# Patient Record
Sex: Male | Born: 1980 | Race: White | Hispanic: No | Marital: Married | Smoking: Never smoker
Health system: Southern US, Community
[De-identification: ages and names within clinical notes are randomized; demographics above are authoritative.]

## PROBLEM LIST (undated history)

## (undated) DIAGNOSIS — Z95828 Presence of other vascular implants and grafts: Secondary | ICD-10-CM

## (undated) DIAGNOSIS — B019 Varicella without complication: Secondary | ICD-10-CM

## (undated) DIAGNOSIS — I429 Cardiomyopathy, unspecified: Secondary | ICD-10-CM

## (undated) HISTORY — DX: Presence of other vascular implants and grafts: Z95.828

## (undated) HISTORY — DX: Varicella without complication: B01.9

## (undated) HISTORY — DX: Cardiomyopathy, unspecified: I42.9

## (undated) HISTORY — PX: LEG SURGERY: SHX1003

## (undated) HISTORY — PX: ANTERIOR CRUCIATE LIGAMENT REPAIR: SHX115

---
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
## 2003-06-25 HISTORY — PX: IVC FILTER INSERTION: CATH118245

## 2003-06-25 HISTORY — PX: ABDOMINAL SURGERY: SHX537

## 2003-06-25 HISTORY — PX: BACK SURGERY: SHX140

## 2003-10-09 ENCOUNTER — Inpatient Hospital Stay (HOSPITAL_COMMUNITY): Admission: AC | Admit: 2003-10-09 | Discharge: 2003-10-17 | Payer: Self-pay

## 2003-10-17 ENCOUNTER — Inpatient Hospital Stay (HOSPITAL_COMMUNITY)
Admission: RE | Admit: 2003-10-17 | Discharge: 2003-10-21 | Payer: Self-pay | Admitting: Physical Medicine & Rehabilitation

## 2003-11-15 ENCOUNTER — Encounter: Admission: RE | Admit: 2003-11-15 | Discharge: 2003-11-15 | Payer: Self-pay | Admitting: Neurosurgery

## 2003-11-21 ENCOUNTER — Observation Stay (HOSPITAL_COMMUNITY): Admission: AD | Admit: 2003-11-21 | Discharge: 2003-11-22 | Payer: Self-pay | Admitting: Orthopedic Surgery

## 2003-12-15 ENCOUNTER — Encounter: Admission: RE | Admit: 2003-12-15 | Discharge: 2003-12-15 | Payer: Self-pay | Admitting: Neurosurgery

## 2003-12-22 ENCOUNTER — Ambulatory Visit (HOSPITAL_COMMUNITY): Admission: RE | Admit: 2003-12-22 | Discharge: 2003-12-22 | Payer: Self-pay | Admitting: Diagnostic Radiology

## 2004-01-12 ENCOUNTER — Encounter: Admission: RE | Admit: 2004-01-12 | Discharge: 2004-01-12 | Payer: Self-pay | Admitting: Neurosurgery

## 2004-01-16 ENCOUNTER — Encounter
Admission: RE | Admit: 2004-01-16 | Discharge: 2004-04-15 | Payer: Self-pay | Admitting: Physical Medicine & Rehabilitation

## 2004-04-03 ENCOUNTER — Encounter: Admission: RE | Admit: 2004-04-03 | Discharge: 2004-04-03 | Payer: Self-pay | Admitting: Neurosurgery

## 2004-06-24 HISTORY — PX: REFRACTIVE SURGERY: SHX103

## 2004-10-09 ENCOUNTER — Encounter: Admission: RE | Admit: 2004-10-09 | Discharge: 2004-10-09 | Payer: Self-pay | Admitting: Neurosurgery
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
## 2004-12-08 IMAGING — CR DG LUMBAR SPINE 2-3V
3 series · 3 of 3 positions shown · non-contrast
Comparison: none

CLINICAL DATA: Status post lumbar surgery. 
 LUMBAR SPINE 2-3 VIEWS: 
 Comparison 12/15/03.  There has been a previous posterior lumbar fusion from the L2 through L5 levels.  The posterior bars and pedicle screws appear intact and in satisfactory position.  The mild compression of L3 is unchanged.  The inferior vena also remains stable in position.

[view not recorded (1 of 3)]
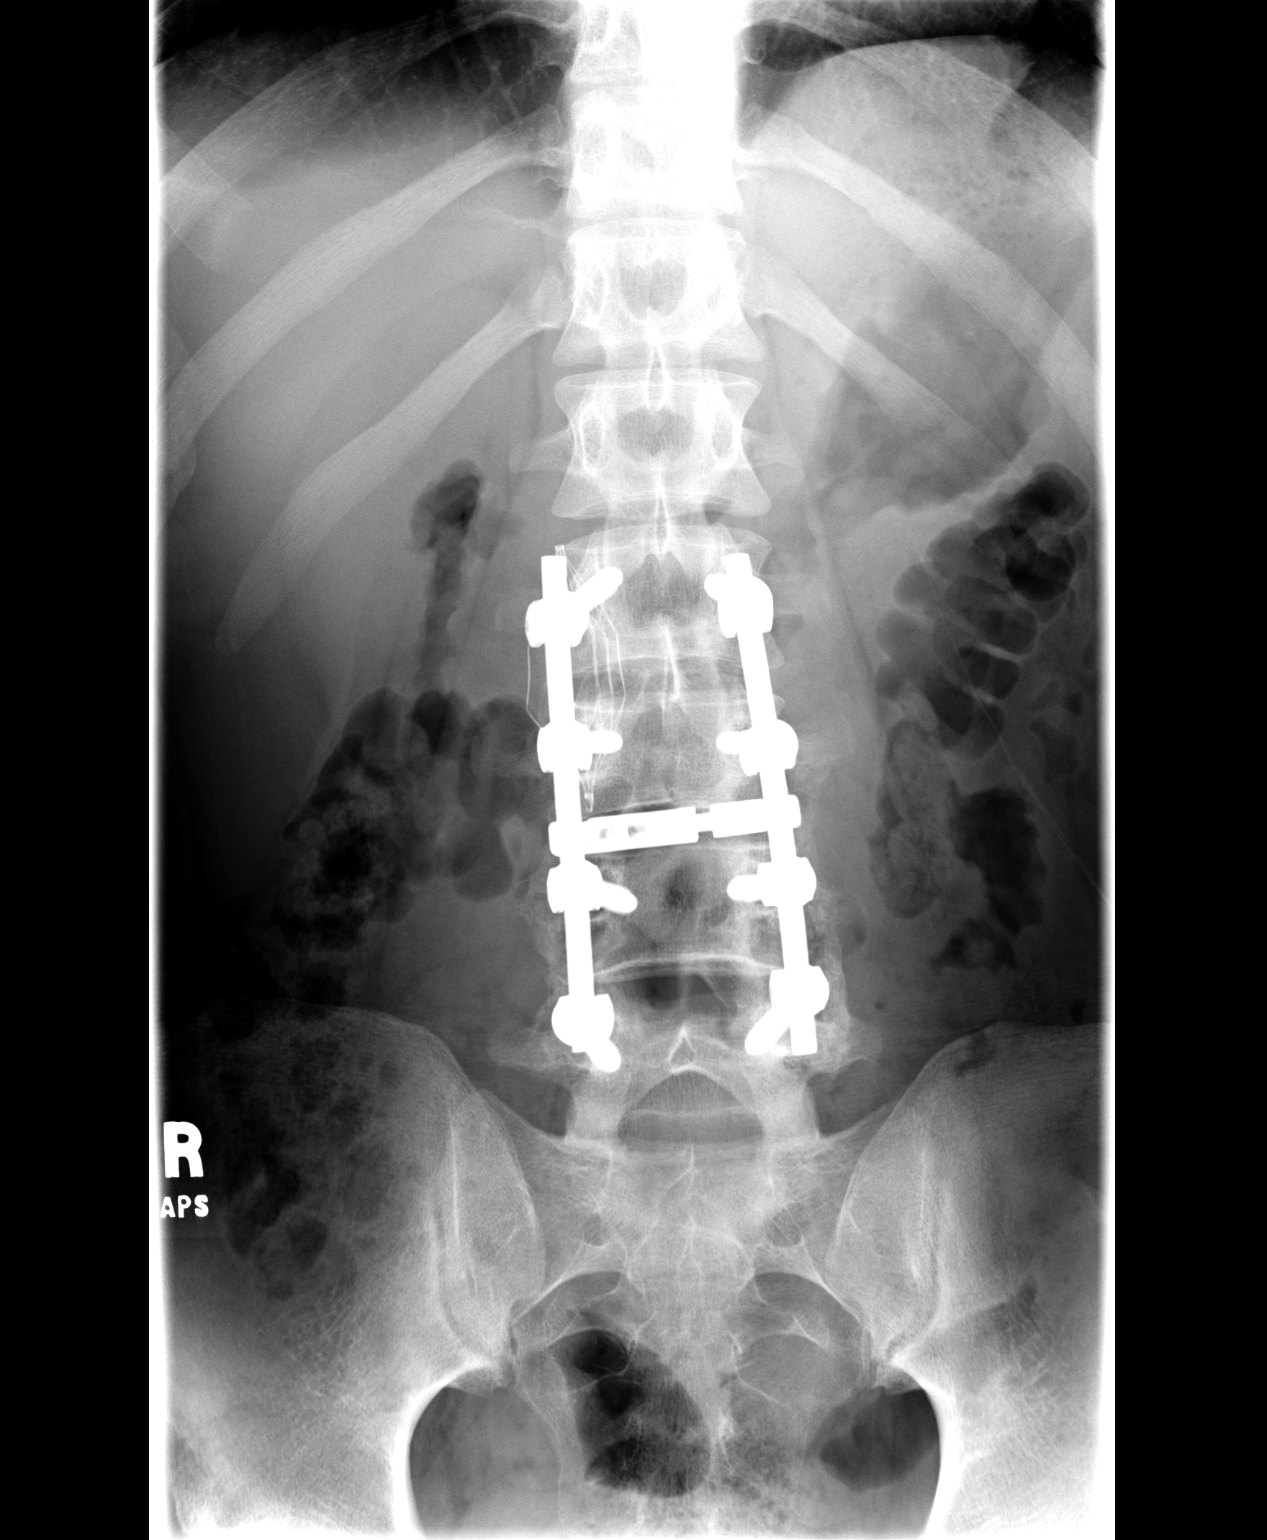

[view not recorded (2 of 3)]
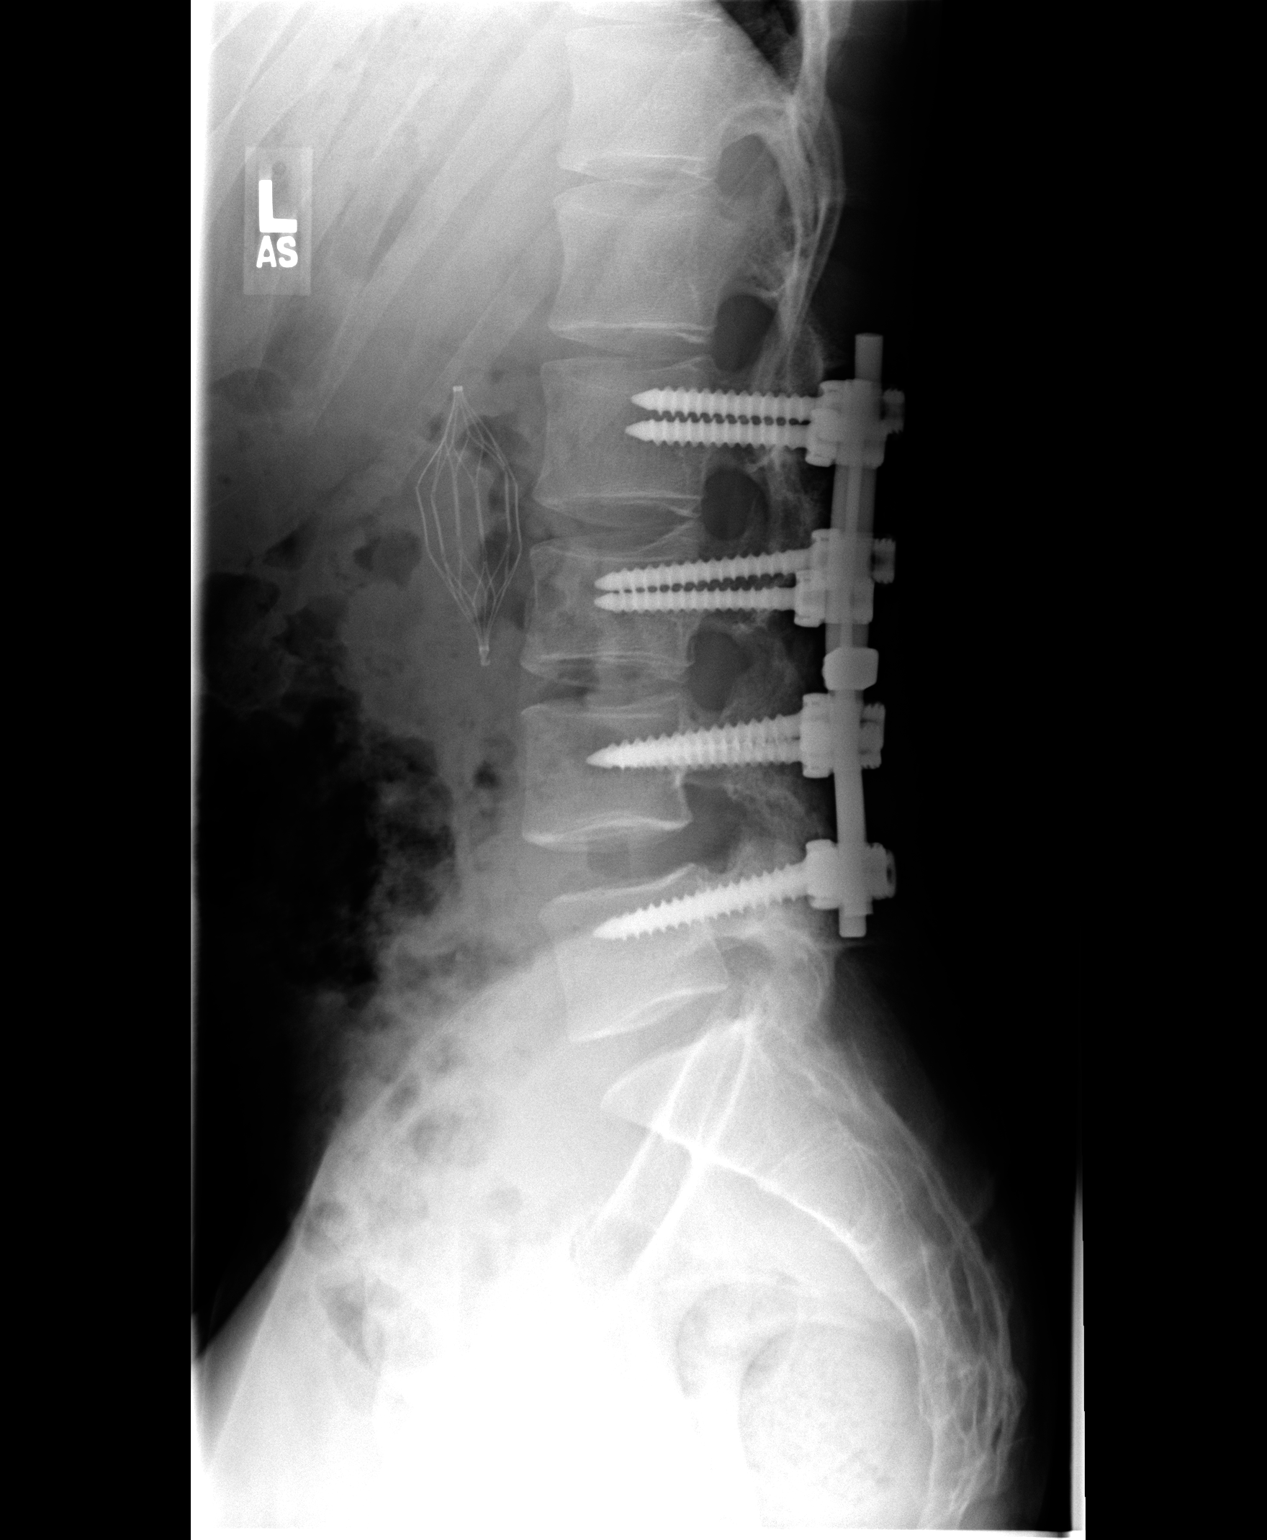

[view not recorded (3 of 3)]
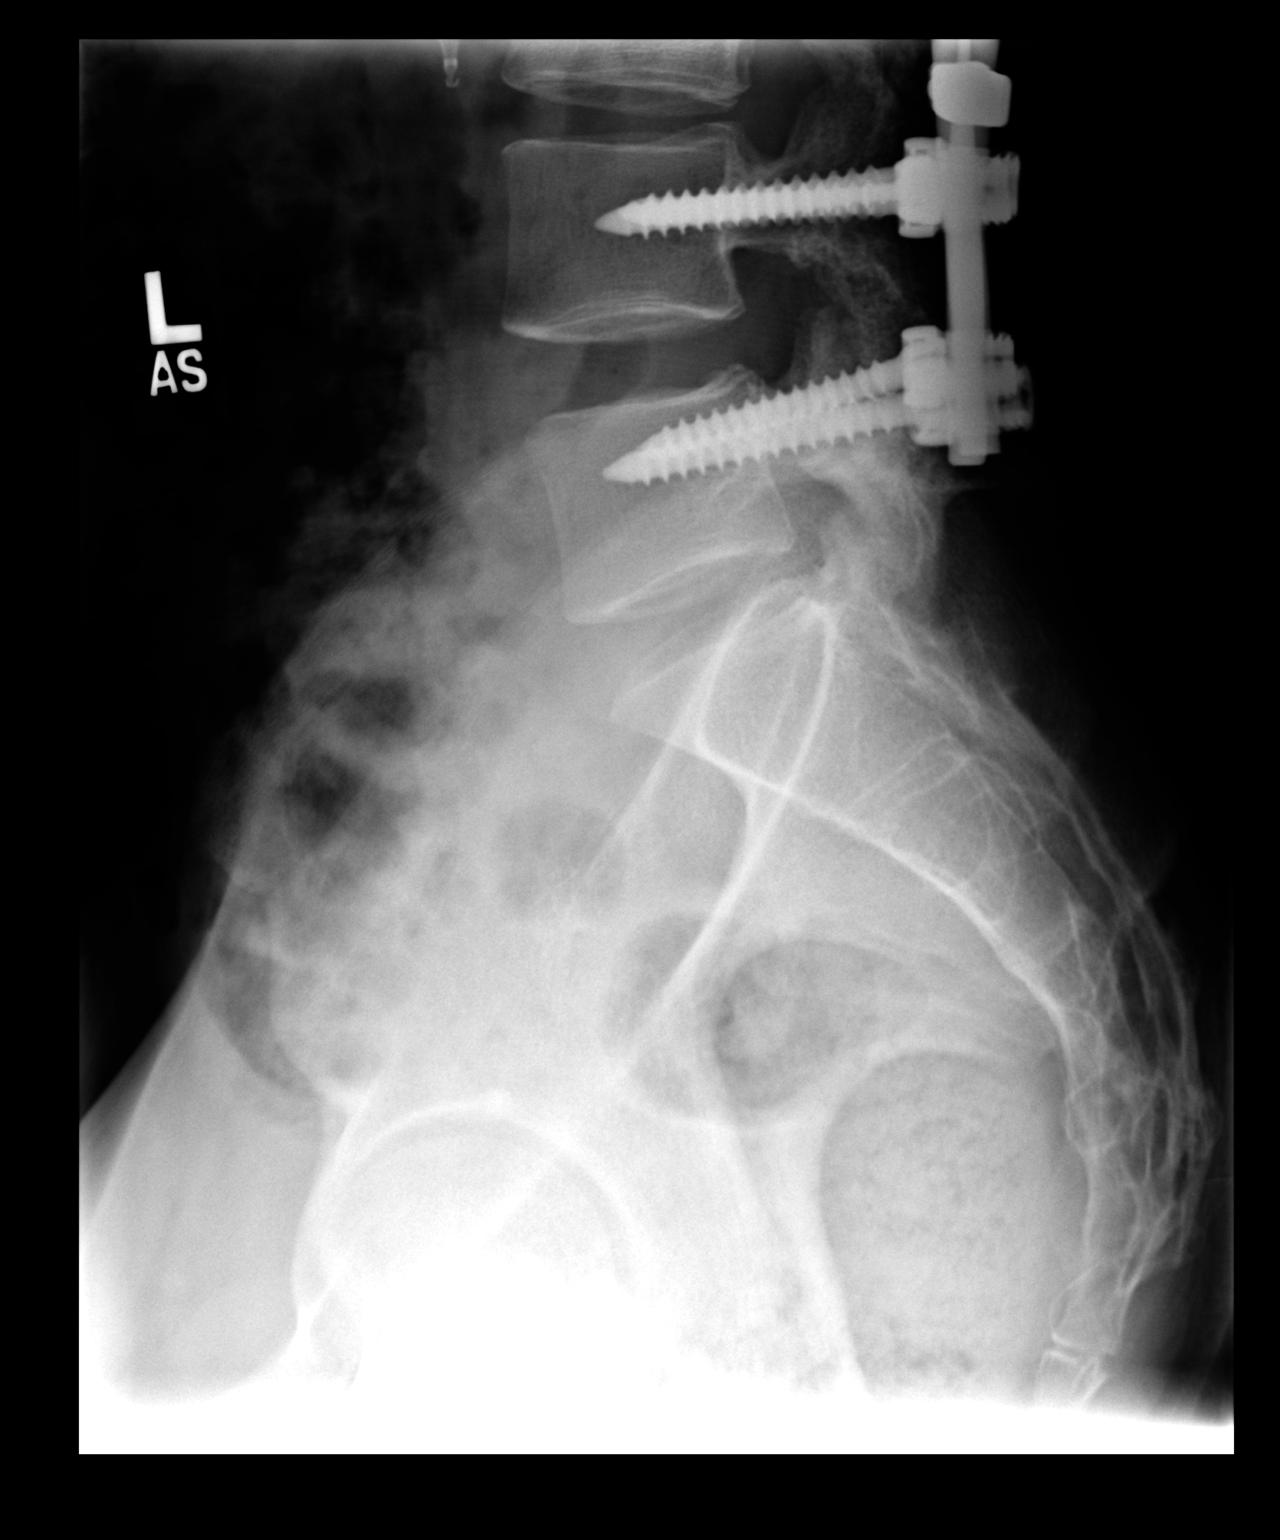

[3 of 3 positions shown; findings below may reference images not displayed]

IMPRESSION: Stable appearance of the posterior lumbar fusion L2 through L5 levels.  Stable position of inferior vena cava.

## 2007-06-25 HISTORY — PX: CHOLECYSTECTOMY: SHX55

## 2008-03-10 ENCOUNTER — Encounter: Payer: Self-pay | Admitting: Gastroenterology

## 2008-03-10 ENCOUNTER — Ambulatory Visit (HOSPITAL_COMMUNITY): Admission: RE | Admit: 2008-03-10 | Discharge: 2008-03-10 | Payer: Self-pay | Admitting: Family Medicine

## 2008-03-11 ENCOUNTER — Telehealth: Payer: Self-pay | Admitting: Gastroenterology

## 2008-03-11 ENCOUNTER — Ambulatory Visit: Payer: Self-pay | Admitting: Gastroenterology

## 2008-03-11 DIAGNOSIS — R1013 Epigastric pain: Secondary | ICD-10-CM | POA: Insufficient documentation

## 2008-03-11 DIAGNOSIS — R109 Unspecified abdominal pain: Secondary | ICD-10-CM | POA: Insufficient documentation

## 2008-03-14 ENCOUNTER — Ambulatory Visit: Payer: Self-pay | Admitting: Gastroenterology

## 2008-03-14 ENCOUNTER — Encounter: Payer: Self-pay | Admitting: Gastroenterology

## 2008-03-15 ENCOUNTER — Telehealth: Payer: Self-pay | Admitting: Gastroenterology

## 2008-03-15 ENCOUNTER — Encounter: Payer: Self-pay | Admitting: Gastroenterology

## 2008-04-07 DIAGNOSIS — K2100 Gastro-esophageal reflux disease with esophagitis, without bleeding: Secondary | ICD-10-CM | POA: Insufficient documentation

## 2008-04-07 DIAGNOSIS — K2970 Gastritis, unspecified, without bleeding: Secondary | ICD-10-CM | POA: Insufficient documentation

## 2008-04-07 DIAGNOSIS — K2990 Gastroduodenitis, unspecified, without bleeding: Secondary | ICD-10-CM

## 2008-04-12 ENCOUNTER — Ambulatory Visit: Payer: Self-pay | Admitting: Gastroenterology

## 2008-04-13 ENCOUNTER — Ambulatory Visit (HOSPITAL_COMMUNITY): Admission: RE | Admit: 2008-04-13 | Discharge: 2008-04-13 | Payer: Self-pay | Admitting: Gastroenterology

## 2008-04-13 DIAGNOSIS — K8020 Calculus of gallbladder without cholecystitis without obstruction: Secondary | ICD-10-CM | POA: Insufficient documentation

## 2008-04-14 ENCOUNTER — Telehealth: Payer: Self-pay | Admitting: Gastroenterology

## 2008-05-31 ENCOUNTER — Ambulatory Visit (HOSPITAL_COMMUNITY): Admission: RE | Admit: 2008-05-31 | Discharge: 2008-05-31 | Payer: Self-pay | Admitting: General Surgery

## 2008-05-31 ENCOUNTER — Encounter (INDEPENDENT_AMBULATORY_CARE_PROVIDER_SITE_OTHER): Payer: Self-pay | Admitting: General Surgery

## 2009-03-10 IMAGING — US US ABDOMEN COMPLETE
1 series · 13 of 25 positions shown · non-contrast
Comparison: 03/10/2008

CLINICAL DATA: Abdominal pain

ABDOMEN ULTRASOUND
TECHNIQUE: Complete abdominal ultrasound examination was performed
including evaluation of the liver, gallbladder, bile ducts,
pancreas, kidneys, spleen, IVC, and abdominal aorta.

[Series 1: unknown · 0.33mm/px · 13 of 91 slices shown]
[im 1/91]
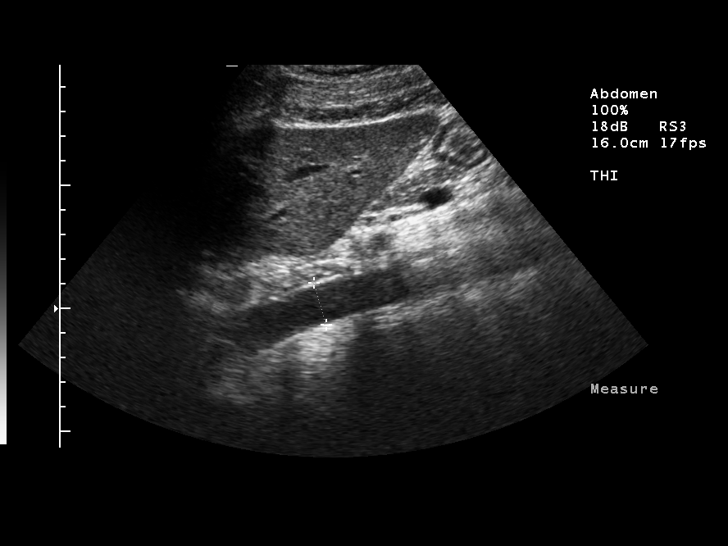
[im 8/91]
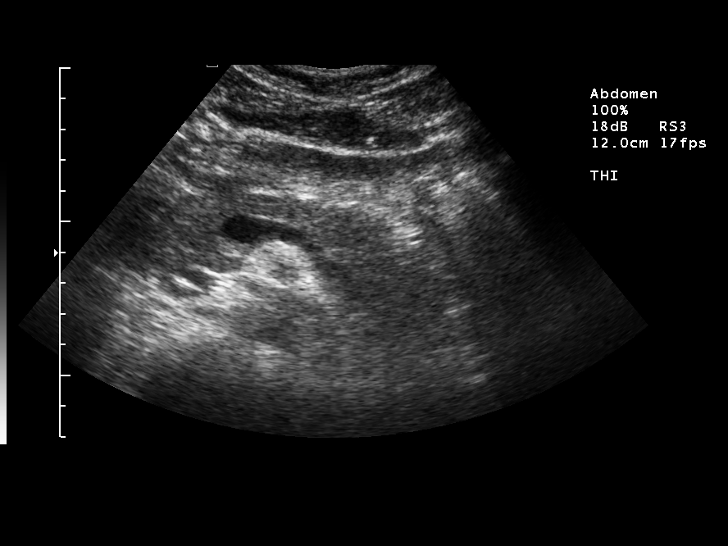
[im 16/91]
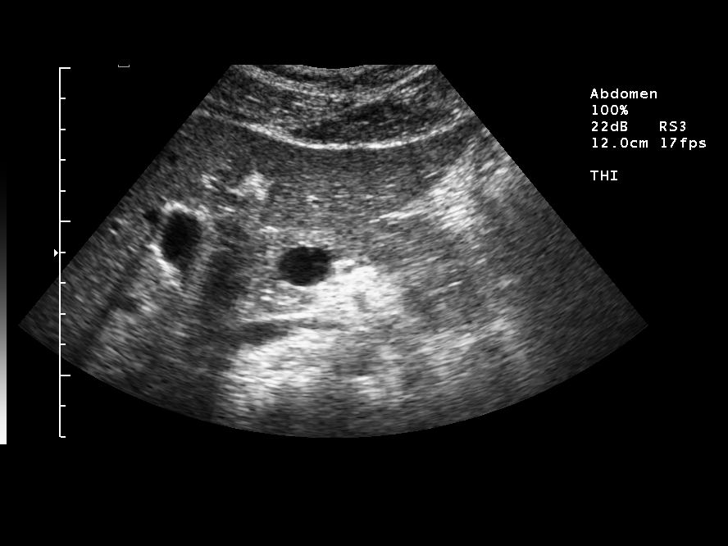
[im 23/91]
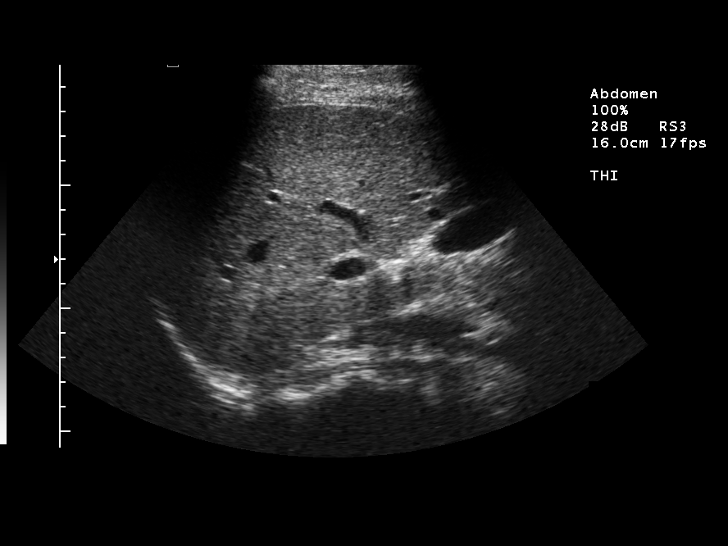
[im 31/91]
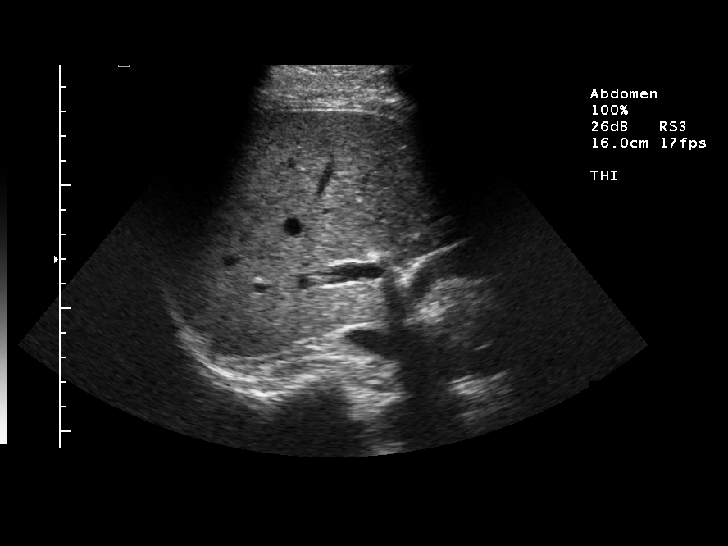
[im 38/91]
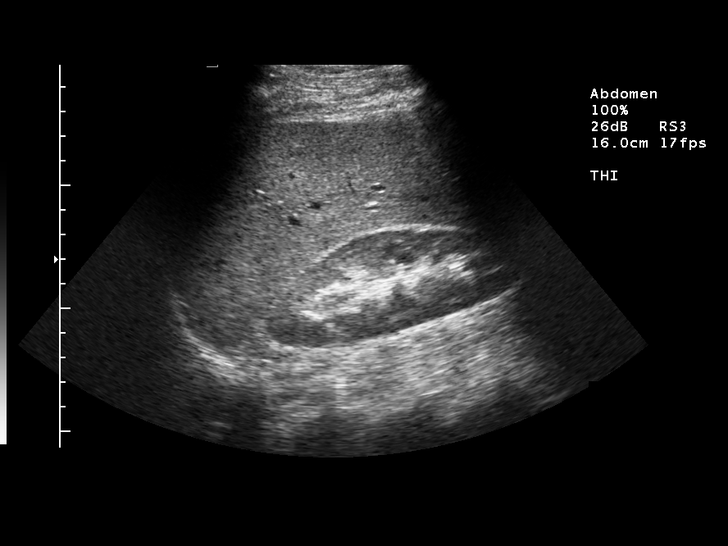
[im 46/91]
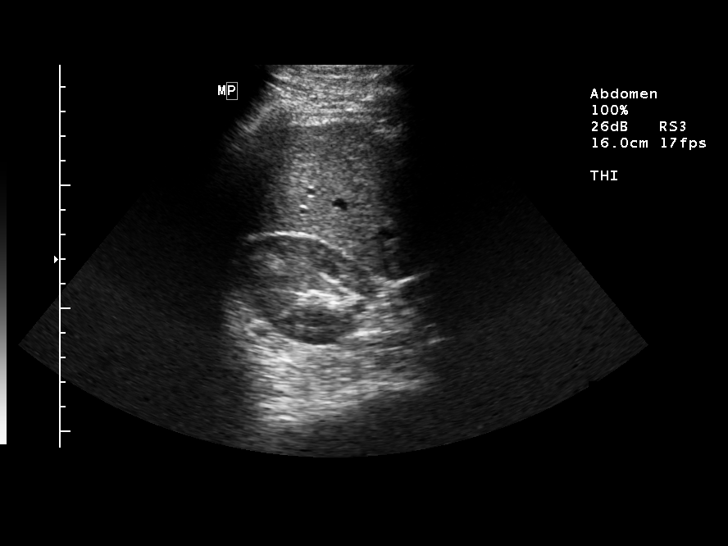
[im 53/91]
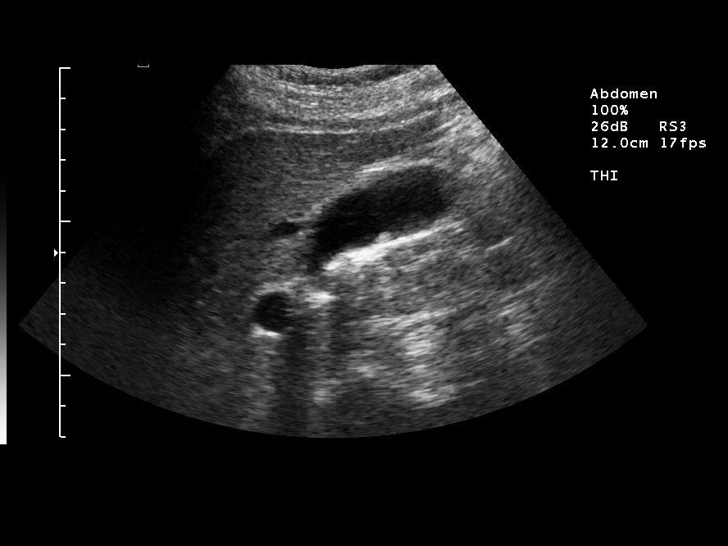
[im 61/91]
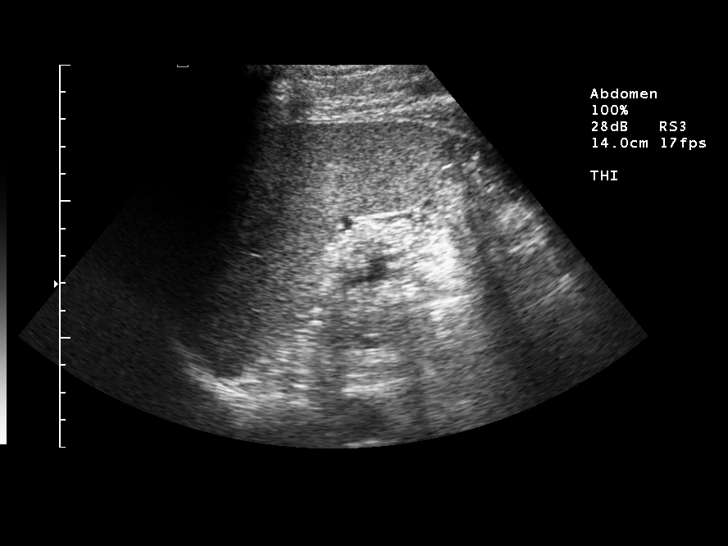
[im 68/91]
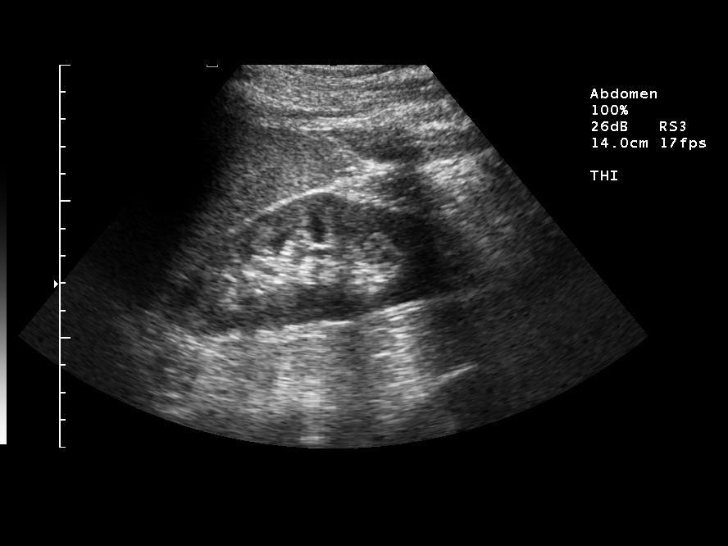
[im 76/91]
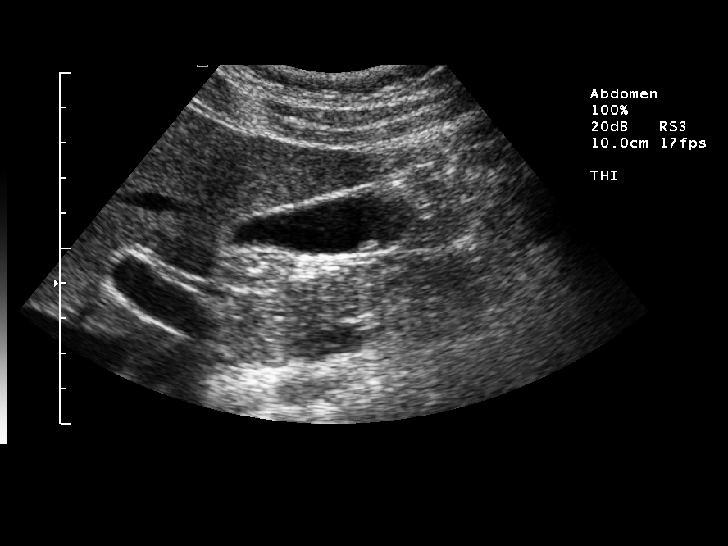
[im 83/91]
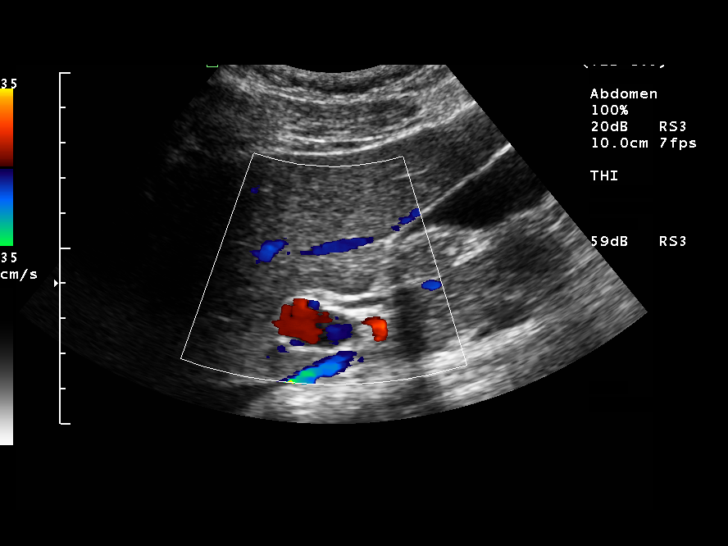
[im 91/91]
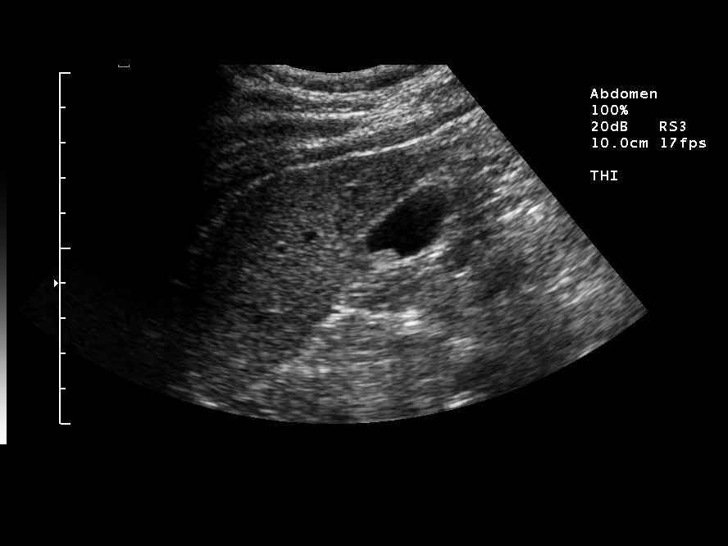

[13 of 25 positions shown; findings below may reference images not displayed]

FINDINGS: The aorta demonstrates normal tapering course.  The
patient has a known IVC filter; the IVC appears otherwise
unremarkable.  No discrete abnormality of the spleen is identified.

The liver appears sonographically unremarkable.  No intrahepatic
biliary dilatation or focal hepatic lesion identified. The common
bile duct appears unremarkable, measuring 2 mm in diameter.

The right kidney measures 11.4 cm in greatest length.  The left
kidney measures 11.6 cm.  No renal stones, masses, or
hydronephrosis.

There is at least one mobile none shadowing hyperechogenicity
within the gallbladder, measuring up to 7 mm in maximum diameter.
The appearance is compatible with a nonshadowing gallstone.

No gallbladder wall thickening or pericholecystic fluid is
identified.  Sonographic Murphy's sign is absent.

The pancreas cannot be well seen due to overlying bowel gas.
IMPRESSION: 1.  Nonshadowing gallstone in the gallbladder, without gallbladder
wall thickening, pericholecystic fluid, or sonographic Murphy's
sign.
2.  The patient has a known IVC filter.

## 2010-11-06 NOTE — Op Note (Signed)
John Yang, John Yang                ACCOUNT NO.:  [id number]   MEDICAL RECORD NO.:  [id number]          PATIENT TYPE:  OIB   LOCATION:  2550                         FACILITY:  [facility]   PHYSICIAN:  Ollen Gross. Vernell Morgans, M.D. DATE OF BIRTH:  Jan 31, 1981   DATE OF PROCEDURE:  05/31/2008  DATE OF DISCHARGE:  05/31/2008                               OPERATIVE REPORT   PREOPERATIVE DIAGNOSIS:  Gallstones.   POSTOPERATIVE DIAGNOSIS:  Gallstones.   PROCEDURE:  Laparoscopic cholecystectomy with intraoperative  cholangiogram.   SURGEON:  Ollen Gross. Vernell Morgans, MD   ASSISTANT:  Adolph Pollack, MD   ANESTHESIA:  General endotracheal.   PROCEDURE IN DETAIL:  After informed consent was obtained, the patient  was brought to the operating room and placed in supine position on the  operating room table.  After adequate induction of general anesthesia,  the patient's abdomen was prepped with Betadine and draped in usual  sterile manner.  The patient had a previous upper midline incision from  a trauma exploration, the area below the umbilicus though was free.  This area was infiltrated with 0.25% Marcaine.  A small incision was  made with a 15 blade knife.  This incision was carried down through the  subcutaneous tissue bluntly with a hemostat and [employer] retractors  until the linea alba was identified.  The linea alba was incised with  the 15 blade knife and each side was grasped with Kocher clamps and  elevated anteriorly.  The preperitoneal space was then probed bluntly  with a hemostat until the peritoneum was opened and access was gained to  the abdominal cavity.  A 0-Vicryl purse-string stitch was placed in the  fascia around the opening.  A Hasson cannula was placed through the  opening and anchored in place with the previously placed 0-Vicryl purse-  string stitch.  The abdomen was then insufflated with carbon dioxide  without difficulty.  The patient was placed in reverse Trendelenburg  position and rotated with right side up.  A laparoscope was inserted  through the Hasson cannula and the right upper quadrant was inspected.  The patient had some adhesions along the midportion of his upper midline  incision, but we were able to see his right upper quadrant very clearly.  Next the epigastric region was infiltrated with 0.25% Marcaine.  A small  incision was made with a 15 blade knife and a 10-mm port was placed  bluntly through this incision into the abdominal cavity under direct  vision.  Sites were then chosen laterally on the right side of abdomen  for placement of 5-mm ports.  Each of these areas were infiltrated with  0.25% Marcaine.  Small stab incisions were made with a 15 blade knife  and 5-mm ports were placed bluntly through these incisions into the  abdominal cavity under direct vision.  A blunt grasper was placed  through the lateral-most 5-mm port and used to grasp the dome of  gallbladder and elevated anteriorly and superiorly.  There were some  omental adhesions to the body of the gallbladder that were taken down  sharply with laparoscopic scissors.  Once this was accomplished, we were  able to see the full length of the gallbladder.  Another blunt grasper  was placed through another 5-mm port and used to retract on the body and  neck of the gallbladder.  A dissector was placed through the epigastric  port and using the electrocautery, the peritoneal reflection of the  gallbladder neck was opened.  Blunt dissection was then carried out in  this area until the gallbladder neck and cystic duct junction was  readily identified and a good window was created.  A single clip was  placed on the gallbladder neck and a small ductotomy was made just below  the clip with a laparoscopic scissors.  At this point, we did experience  some small amount of arterial bleeding.  This was coming from a small  little bridging branch vessel posterior to the cystic duct.  We were   able to control this somewhat with a small clip posterior to the cystic  duct.  Next, a 14-gauge Angiocath was placed percutaneously through the  anterior abdominal wall under direct vision.  A Reddick cholangiogram  catheter was then placed through the Angiocath and flushed.  The Reddick  catheter was then placed within the cystic duct and anchored in place  with a clip.  A cholangiogram was obtained that showed no filling  defects, good emptying in the duodenum, and good length on the cystic  duct.  The anchoring clip and catheters were removed from the patient.  Three clips were placed proximally on the cystic duct and the duct was  divided between the two sets of clips.  At this point, once the cystic  duct was divided, we could then see the opening in the vessel posterior  to the cystic duct and we were able to control this with more clips.  The cystic artery was also identified and dissected bluntly in a  circumferential manner until a good window was created.  Two clips were  placed proximally and one distally on the artery and the artery was  divided between the two with the laparoscopic scissors.  Next, the  laparoscopic hook cautery device was used to separate the gallbladder  from liver bed.  Prior to completely detaching the gallbladder from  liver bed, the liver bed was inspected and several small bleeding points  were coagulated with electrocautery until the area was completely  hemostatic.  Gallbladder was then detached away from the liver bed  without difficulty with the hook cautery.  A laparoscopic bag was  inserted through the epigastric port.  The gallbladder was placed in the  bag and the bag was sealed.  The abdomen was then irrigated with copious  amounts of saline until the effluent was clear.  The rest of the abdomen  was inspected.  No other significant abnormalities were noted except for  the adhesions of some colon just along the midline incision, which we  were  able to avoid.  Next, the laparoscope was then moved to the  epigastric port.  A gallbladder grasper was placed through the Hasson  cannula and used to grasp and open the bag.  The bag with the  gallbladder was removed through the infraumbilical port without  difficulty.  The fascial defect was closed with the previously placed 0-  Vicryl purse-string stitch as well as with another figure-of-eight 0-  Vicryl stitch.  The rest of ports were then removed under direct vision  and were found  to be hemostatic.  The gas was allowed to escape.  The  fascia of the epigastric port was also closed with interrupted 0-Vicryl  stitch.  The rest of skin incisions were closed with interrupted 4-0  Monocryl subcuticular stitches.  Dermabond dressings were applied.  The  patient tolerated the procedure well.  At the end of the case, all  needle, sponge, and instrument counts were correct.  The patient was  then awakened and taken to recovery room in stable condition.      Ollen Gross. Vernell Morgans, M.D.  Electronically Signed     PST/MEDQ  D:  05/31/2008  T:  06/01/2008  Job:  [id number]
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
## 2011-03-28 LAB — CBC
Hemoglobin: 14.8 g/dL (ref 13.0–17.0)
Platelets: 226 10*3/uL (ref 150–400)
RBC: 4.92 MIL/uL (ref 4.22–5.81)
RDW: 13.3 % (ref 11.5–15.5)

## 2011-03-28 LAB — DIFFERENTIAL
Lymphocytes Relative: 44 % (ref 12–46)
Monocytes Absolute: 0.5 10*3/uL (ref 0.1–1.0)
Neutro Abs: 1.8 10*3/uL (ref 1.7–7.7)

## 2011-03-28 LAB — COMPREHENSIVE METABOLIC PANEL
ALT: 26 U/L (ref 0–53)
Calcium: 9.5 mg/dL (ref 8.4–10.5)
Chloride: 103 mEq/L (ref 96–112)
GFR calc non Af Amer: >60 mL/min (ref >60)
Glucose, Bld: 86 mg/dL (ref 70–99)

## 2011-11-29 ENCOUNTER — Ambulatory Visit (INDEPENDENT_AMBULATORY_CARE_PROVIDER_SITE_OTHER): Payer: Federal, State, Local not specified - PPO | Admitting: Family Medicine

## 2011-11-29 ENCOUNTER — Ambulatory Visit: Payer: Federal, State, Local not specified - PPO | Admitting: Family Medicine

## 2011-11-29 VITALS — BP 150/82 | HR 61 | Temp 98.3°F | Resp 16 | Ht 70.0 in | Wt 168.8 lb

## 2011-11-29 DIAGNOSIS — L237 Allergic contact dermatitis due to plants, except food: Secondary | ICD-10-CM

## 2011-11-29 DIAGNOSIS — L255 Unspecified contact dermatitis due to plants, except food: Secondary | ICD-10-CM

## 2011-11-29 NOTE — Progress Notes (Signed)
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 Yesterday developed some itchy papules on his left side.  Objective: Vesicular abruption typical of poison ivy on her arms and left side.  Assessment: Poison ivy  Plan: Prednisone 20 mg to be taken 3-2-2-to-1-1-1

## 2011-11-29 NOTE — Progress Notes (Signed)
This is a 31 year old man works at the [employer]. He was doing chainsaw clearing at home last weekend and subsequently developed rash typical for poison ivy on his arms and abdomen.  Objective: Vesicular eruption forearms and left abdomen where clothing did not protect him.  Assessment poison ivy  Plan: Prednisone 20 mg 3-to-2-to-1-1-1

## 2011-12-09 ENCOUNTER — Other Ambulatory Visit: Payer: Self-pay

## 2011-12-09 NOTE — Telephone Encounter (Signed)
Pt would like to know if he could have refill on the prednisone that he was prescribed during his last visit for the poison ivy, pt states that the poison ivy has not yet cleared up and he also has a few new spots as well. Pharmacy: [facility] on [address]

## 2011-12-10 ENCOUNTER — Ambulatory Visit (INDEPENDENT_AMBULATORY_CARE_PROVIDER_SITE_OTHER): Payer: Federal, State, Local not specified - PPO | Admitting: Physician Assistant

## 2011-12-10 VITALS — BP 116/67 | HR 65 | Temp 98.1°F | Resp 16 | Ht 69.0 in | Wt 168.0 lb

## 2011-12-10 DIAGNOSIS — L282 Other prurigo: Secondary | ICD-10-CM

## 2011-12-10 DIAGNOSIS — L42 Pityriasis rosea: Secondary | ICD-10-CM

## 2011-12-10 MED ORDER — RANITIDINE HCL 150 MG PO TABS: 150.0000 mg | ORAL_TABLET | Freq: Two times a day (BID) | ORAL | Status: DC

## 2011-12-10 MED ORDER — TRIAMCINOLONE ACETONIDE 0.1 % EX CREA: TOPICAL_CREAM | CUTANEOUS | Status: AC

## 2011-12-10 NOTE — Telephone Encounter (Signed)
LM with voicemail to call back if he would like Triamcinolone

## 2011-12-10 NOTE — Telephone Encounter (Signed)
It is not a good idea to repeat a course of prednisone. Can call in some triamcinolone.

## 2011-12-10 NOTE — Patient Instructions (Addendum)
Pityriasis Rosea Pityriasis rosea is a rash which is probably caused by a virus. It generally starts as a scaly, red patch on the trunk (the area of the body that a t-shirt would cover) but does not appear on sun exposed areas. The rash is usually preceded by an initial larger spot called the "herald patch" a week or more before the rest of the rash appears. Generally within one to two days the rash appears rapidly on the trunk, upper arms, and sometimes the upper legs. The rash usually appears as flat, oval patches of scaly pink color. The rash can also be raised and one is able to feel it with a finger. The rash can also be finely crinkled and may slough off leaving a ring of scale around the spot. Sometimes a mild sore throat is present with the rash. It usually affects children and young adults in the spring and autumn. Women are more frequently affected than men. TREATMENT  Pityriasis rosea is a self-limited condition. This means it goes away within 4 to 8 weeks without treatment. The spots may persist for several months, especially in darker-colored skin after the rash has resolved and healed. Benadryl and steroid creams may be used if itching is a problem. SEEK MEDICAL CARE IF:   Your rash does not go away or persists longer than three months.   You develop fever and joint pain.   You develop severe headache and confusion.   You develop breathing difficulty, vomiting and/or extreme weakness.  Document Released: 07/17/2001 Document Revised: 05/30/2011 Document Reviewed: 08/05/2008 [employer] Patient Information 2012 [employer]. 

## 2011-12-10 NOTE — Progress Notes (Signed)
  Subjective:    Patient ID: John Yang, male    DOB: 10/06/1980, 31 y.o.   MRN: [id number]  HPI Patient presents with pruritic rash since yesterday. Was here 6/7 for poison ivy and was prescribed a prednisone taper and his poison ivy subsequently improved. Says everything was doing well until last night when he noticed an erythematous patch on his torso. After his entire trunk erupted in a maculopapular erythematous rash and has been intensely itchy. Says he has taken Benadryl which did not help much. He denies any new medications, soaps, detergents, or recent travel. He finished the prednisone taper the end of last week.     Review of Systems  Constitutional: Negative for fever and chills.  HENT: Negative for sore throat and rhinorrhea.   Eyes: Negative for itching.  Respiratory: Negative for wheezing.   Gastrointestinal: Negative for nausea and vomiting.  Skin: Positive for rash.       Objective:   Physical Exam  Constitutional: He is oriented to person, place, and time. He appears well-developed and well-nourished.  HENT:  Head: Normocephalic and atraumatic.  Right Ear: External ear normal.  Left Ear: External ear normal.  Mouth/Throat: No oropharyngeal exudate.  Eyes: Conjunctivae are normal.  Neck: Normal range of motion.  Cardiovascular: Normal rate, regular rhythm and normal heart sounds.   Pulmonary/Chest: Effort normal and breath sounds normal.  Lymphadenopathy:    He has no cervical adenopathy.  Neurological: He is alert and oriented to person, place, and time.  Skin:     Psychiatric: He has a normal mood and affect. His behavior is normal. Judgment and thought content normal.          Assessment & Plan:   1. Pityriasis rosea  Reassurance provided. Discussed that this is a self-limited rash and that treatment focuses on symptom control Recommend triamcinolone cream as directed Continue Benadryl at night. Will add Zyrtec and Zantac to help with itch.    2.  Pruritic rash  ranitidine (ZANTAC) 150 MG tablet

## 2012-04-11 ENCOUNTER — Emergency Department (HOSPITAL_COMMUNITY)
Admission: EM | Admit: 2012-04-11 | Discharge: 2012-04-11 | Disposition: A | Discharge status: Home / Self Care | Payer: Self-pay | Source: Home / Self Care | Attending: Family Medicine | Admitting: Family Medicine

## 2012-04-11 ENCOUNTER — Emergency Department (INDEPENDENT_AMBULATORY_CARE_PROVIDER_SITE_OTHER): Payer: Self-pay

## 2012-04-11 ENCOUNTER — Encounter (HOSPITAL_COMMUNITY): Payer: Self-pay | Admitting: Emergency Medicine

## 2012-04-11 DIAGNOSIS — J209 Acute bronchitis, unspecified: Secondary | ICD-10-CM

## 2012-04-11 DIAGNOSIS — J019 Acute sinusitis, unspecified: Secondary | ICD-10-CM

## 2012-04-11 LAB — POCT RAPID STREP A: Streptococcus, Group A Screen (Direct): NEGATIVE

## 2012-04-11 MED ORDER — FLUTICASONE PROPIONATE 50 MCG/ACT NA SUSP: 1.0000 | Freq: Two times a day (BID) | NASAL | Status: DC

## 2012-04-11 MED ORDER — AMOXICILLIN-POT CLAVULANATE 875-125 MG PO TABS: 1.0000 | ORAL_TABLET | Freq: Two times a day (BID) | ORAL | Status: DC

## 2012-04-11 NOTE — ED Provider Notes (Signed)
History     CSN: [id number]  Arrival date & time 04/11/12  0904   First MD Initiated Contact with Patient 04/11/12 0920      Chief Complaint  Patient presents with  . URI    (Consider location/radiation/quality/duration/timing/severity/associated sxs/prior treatment) Patient is a 31 y.o. male presenting with URI. The history is provided by the patient.  URI The primary symptoms include fever, fatigue, sore throat and cough. Primary symptoms do not include wheezing, abdominal pain, nausea, vomiting or rash. The current episode started 6 to 7 days ago. This is a new problem. The problem has been gradually worsening.  Symptoms associated with the illness include chills, congestion and rhinorrhea.    History reviewed. No pertinent past medical history.  Past Surgical History  Procedure Date  . Cholecystectomy     History reviewed. No pertinent family history.  History  Substance Use Topics  . Smoking status: Never Smoker   . Smokeless tobacco: Not on file  . Alcohol Use: No      Review of Systems  Constitutional: Positive for fever, chills and fatigue.  HENT: Positive for congestion, sore throat, rhinorrhea and postnasal drip.   Respiratory: Positive for cough. Negative for shortness of breath and wheezing.   Gastrointestinal: Negative.  Negative for nausea, vomiting and abdominal pain.  Skin: Negative for rash.    Allergies  Sulfa antibiotics and Sulfonamide derivatives  Home Medications   Current Outpatient Rx  Name Route Sig Dispense Refill  . AMOXICILLIN-POT CLAVULANATE 875-125 MG PO TABS Oral Take 1 tablet by mouth 2 (two) times daily. 28 tablet 0  . FLUTICASONE PROPIONATE 50 MCG/ACT NA SUSP Nasal Place 1 spray into the nose 2 (two) times daily. 1 g 2  . RANITIDINE HCL 150 MG PO TABS Oral Take 1 tablet (150 mg total) by mouth 2 (two) times daily. 60 tablet 0  . TRIAMCINOLONE ACETONIDE 0.1 % EX CREA  APPLY TO AFFECTED AREA BID 45 g 0    BP 123/66  Pulse  95  Temp 98.8 F (37.1 C) (Oral)  Resp 16  SpO2 100%  Physical Exam  Nursing note and vitals reviewed. Constitutional: He is oriented to person, place, and time. He appears well-developed and well-nourished. No distress.  HENT:  Head: Normocephalic.  Right Ear: External ear normal.  Left Ear: External ear normal.  Mouth/Throat: Oropharynx is clear and moist. No oropharyngeal exudate.  Eyes: Pupils are equal, round, and reactive to light.  Neck: Normal range of motion. Neck supple.  Cardiovascular: Normal rate, regular rhythm, normal heart sounds and intact distal pulses.   Pulmonary/Chest: Effort normal and breath sounds normal.  Abdominal: Soft. Bowel sounds are normal.  Neurological: He is alert and oriented to person, place, and time.  Skin: Skin is warm and dry.    ED Course  Procedures (including critical care time)   Labs Reviewed  POCT RAPID STREP A ([facility] CARE ONLY)  POCT RAPID STREP A ([facility] CARE ONLY)   Dg Chest 2 View  04/11/2012  *RADIOLOGY REPORT*  Clinical Data: Fever, cough  CHEST - 2 VIEW  Comparison: 10/10/2003  Findings: Cardiomediastinal silhouette is unremarkable. No acute infiltrate or pulmonary edema.  Bony thorax is stable.  No pleural effusion.  IMPRESSION: No active disease.   Original Report Authenticated By: Natasha Mead, M.D.      1. Sinusitis, acute   2. Bronchitis, acute       MDM  X-rays reviewed and report per radiologist.  Linna Hoff, MD 04/11/12 [phone]

## 2012-04-11 NOTE — ED Notes (Signed)
Pt c/o headache that starts at base of neck and radiates to front of head. Productive cough with green sputum and sore throat. Pt has been having cold chills and night sweats.  Pt denies fever,n/v/d. Symptoms have lasted a week with no relief from otc meds.

## 2013-07-09 ENCOUNTER — Other Ambulatory Visit (HOSPITAL_COMMUNITY): Payer: Self-pay | Admitting: Interventional Radiology

## 2013-07-09 DIAGNOSIS — Z95828 Presence of other vascular implants and grafts: Secondary | ICD-10-CM

## 2013-07-21 ENCOUNTER — Ambulatory Visit
Admission: RE | Admit: 2013-07-21 | Discharge: 2013-07-21 | Disposition: A | Discharge status: Home / Self Care | Payer: BC Managed Care – PPO | Source: Ambulatory Visit | Attending: Interventional Radiology | Admitting: Interventional Radiology

## 2013-07-21 DIAGNOSIS — Z95828 Presence of other vascular implants and grafts: Secondary | ICD-10-CM

## 2014-03-22 ENCOUNTER — Encounter: Payer: Self-pay | Admitting: Gastroenterology

## 2016-06-04 DIAGNOSIS — K080 Exfoliation of teeth due to systemic causes: Secondary | ICD-10-CM | POA: Diagnosis not present

## 2016-12-11 DIAGNOSIS — K080 Exfoliation of teeth due to systemic causes: Secondary | ICD-10-CM | POA: Diagnosis not present

## 2017-07-08 DIAGNOSIS — K080 Exfoliation of teeth due to systemic causes: Secondary | ICD-10-CM | POA: Diagnosis not present

## 2017-10-09 ENCOUNTER — Telehealth: Payer: Self-pay | Admitting: Family Medicine

## 2017-10-09 ENCOUNTER — Ambulatory Visit: Payer: Federal, State, Local not specified - PPO | Admitting: Family Medicine

## 2017-10-09 ENCOUNTER — Encounter: Payer: Self-pay | Admitting: Family Medicine

## 2017-10-09 VITALS — BP 124/80 | HR 59 | Temp 97.9°F | Ht 69.0 in | Wt 176.4 lb

## 2017-10-09 DIAGNOSIS — Z95828 Presence of other vascular implants and grafts: Secondary | ICD-10-CM

## 2017-10-09 DIAGNOSIS — Z23 Encounter for immunization: Secondary | ICD-10-CM

## 2017-10-09 DIAGNOSIS — Z0000 Encounter for general adult medical examination without abnormal findings: Secondary | ICD-10-CM | POA: Diagnosis not present

## 2017-10-09 DIAGNOSIS — M545 Low back pain, unspecified: Secondary | ICD-10-CM | POA: Insufficient documentation

## 2017-10-09 DIAGNOSIS — R5383 Other fatigue: Secondary | ICD-10-CM | POA: Diagnosis not present

## 2017-10-09 DIAGNOSIS — J302 Other seasonal allergic rhinitis: Secondary | ICD-10-CM | POA: Diagnosis not present

## 2017-10-09 DIAGNOSIS — Z13220 Encounter for screening for lipoid disorders: Secondary | ICD-10-CM

## 2017-10-09 DIAGNOSIS — Z114 Encounter for screening for human immunodeficiency virus [HIV]: Secondary | ICD-10-CM | POA: Diagnosis not present

## 2017-10-09 NOTE — Telephone Encounter (Signed)
Copied from CRM [id number]. Topic: Inquiry >> Oct 09, 2017  3:46 PM Windy KalataMichael, Taylor L, NT wrote: Reason for CRM: patient states he was seen today and would like testosterone added to his labs. Would like the nurse to call him if this can be added

## 2017-10-09 NOTE — Patient Instructions (Addendum)
Health Maintenance Due  Topic Date Due  . HIV Screening-declined 03/29/1997  . TETANUS/TDAP-Today at office visit. Good for 10 years.  03/29/2001   Schedule a lab visit at the check out desk within 2 weeks. Return for future fasting labs meaning nothing but water after midnight please. Ok to take your medications with water.
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
## 2017-10-10 ENCOUNTER — Encounter: Payer: Self-pay | Admitting: Family Medicine
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
## 2017-10-10 NOTE — Telephone Encounter (Signed)
I added testosterone to his labs- please inform patient.  We need to make sure this is between 8 and 9 AM and fasting.  Also if the level is low, we will need to repeat it at a later date for confirmation as there is a fair amount of fluctuation with testosterone

## 2017-10-10 NOTE — Assessment & Plan Note (Signed)
2005 retained IVC filter- attempted removal but adhered and unable to remove. had 2nd procedure to make sure no damage

## 2017-10-13 NOTE — Telephone Encounter (Signed)
Spoke with patient who verbalized understanding. He is scheduled for Thursday morning

## 2017-10-16 ENCOUNTER — Other Ambulatory Visit (INDEPENDENT_AMBULATORY_CARE_PROVIDER_SITE_OTHER): Payer: Federal, State, Local not specified - PPO

## 2017-10-16 DIAGNOSIS — R5383 Other fatigue: Secondary | ICD-10-CM

## 2017-10-16 DIAGNOSIS — Z13220 Encounter for screening for lipoid disorders: Secondary | ICD-10-CM | POA: Diagnosis not present

## 2017-10-16 DIAGNOSIS — Z0000 Encounter for general adult medical examination without abnormal findings: Secondary | ICD-10-CM | POA: Diagnosis not present

## 2017-10-16 LAB — CBC
HCT: 44.1 % (ref 39.0–52.0)
HEMOGLOBIN: 15 g/dL (ref 13.0–17.0)
MCHC: 34.1 g/dL (ref 30.0–36.0)
MCV: 87.3 fl (ref 78.0–100.0)
PLATELETS: 227 10*3/uL (ref 150.0–400.0)
RBC: 5.05 Mil/uL (ref 4.22–5.81)
RDW: 13.9 % (ref 11.5–15.5)
WBC: 4 10*3/uL (ref 4.0–10.5)

## 2017-10-16 LAB — COMPREHENSIVE METABOLIC PANEL
ALBUMIN: 4.5 g/dL (ref 3.5–5.2)
ALK PHOS: 54 U/L (ref 39–117)
ALT: 18 U/L (ref 0–53)
AST: 15 U/L (ref 0–37)
BUN: 18 mg/dL (ref 6–23)
CALCIUM: 9.7 mg/dL (ref 8.4–10.5)
CO2: 31 mEq/L (ref 19–32)
CREATININE: 1.31 mg/dL (ref 0.40–1.50)
Chloride: 103 mEq/L (ref 96–112)
GFR: 65.97 mL/min (ref >60.00)
Glucose, Bld: 90 mg/dL (ref 70–99)
POTASSIUM: 4.1 meq/L (ref 3.5–5.1)
Sodium: 141 mEq/L (ref 135–145)
TOTAL PROTEIN: 7 g/dL (ref 6.0–8.3)
Total Bilirubin: 0.5 mg/dL (ref 0.2–1.2)

## 2017-10-16 LAB — LIPID PANEL
CHOLESTEROL: 191 mg/dL (ref 0–200)
HDL: 51.6 mg/dL (ref >39.00)
LDL CALC: 120 mg/dL — AB (ref 0–99)
NonHDL: 139.73
TRIGLYCERIDES: 97 mg/dL (ref 0.0–149.0)
Total CHOL/HDL Ratio: 4
VLDL: 19.4 mg/dL (ref 0.0–40.0)

## 2017-10-16 LAB — TSH: TSH: 2.3 u[IU]/mL (ref 0.35–4.50)

## 2017-10-16 NOTE — Progress Notes (Signed)
  Your CBC was normal (blood counts, infection fighting cells, platelets). Your CMET was normal (kidney, liver, and electrolytes, blood sugar).  Your cholesterol shows mildly high bad cholesterol at 120.  Goal is for this to be under 100.  I would not recommend cholesterol medication.  I would recommend continued efforts for healthy eating, regular exercise. Your thyroid was normal.

## 2017-10-17 ENCOUNTER — Telehealth: Payer: Self-pay

## 2017-10-17 LAB — TESTOSTERONE TOTAL,FREE,BIO, MALES
ALBUMIN MSPROF: 4.5 g/dL (ref 3.6–5.1)
SEX HORMONE BINDING: 31 nmol/L (ref 10–50)
TESTOSTERONE: 462 ng/dL (ref 250–827)
Testosterone, Bioavailable: 133.5 ng/dL (ref >=110.0)
Testosterone, Free: 64.9 pg/mL (ref 46.0–224.0)

## 2017-10-17 NOTE — Progress Notes (Signed)
All testosterone levels came back in normal range

## 2017-10-17 NOTE — Telephone Encounter (Signed)
Called patient and asked if he had viewed his mychart messages. Patient stated he did and her verbalized understanding.

## 2017-10-20 ENCOUNTER — Telehealth: Payer: Self-pay

## 2017-10-20 NOTE — Telephone Encounter (Signed)
Called patient to see if he viewed his mychart message and he did. He didn't have any follow up questions. Patient verbalized understanding.

## 2018-01-07 DIAGNOSIS — K080 Exfoliation of teeth due to systemic causes: Secondary | ICD-10-CM | POA: Diagnosis not present

## 2018-03-01 DIAGNOSIS — L255 Unspecified contact dermatitis due to plants, except food: Secondary | ICD-10-CM | POA: Diagnosis not present

## 2018-07-07 DIAGNOSIS — M5440 Lumbago with sciatica, unspecified side: Secondary | ICD-10-CM | POA: Diagnosis not present

## 2018-07-22 DIAGNOSIS — M5127 Other intervertebral disc displacement, lumbosacral region: Secondary | ICD-10-CM | POA: Diagnosis not present

## 2018-07-22 DIAGNOSIS — M4807 Spinal stenosis, lumbosacral region: Secondary | ICD-10-CM | POA: Diagnosis not present

## 2018-07-22 DIAGNOSIS — M5440 Lumbago with sciatica, unspecified side: Secondary | ICD-10-CM | POA: Diagnosis not present

## 2018-08-04 DIAGNOSIS — M5440 Lumbago with sciatica, unspecified side: Secondary | ICD-10-CM | POA: Diagnosis not present

## 2019-04-19 DIAGNOSIS — S63502A Unspecified sprain of left wrist, initial encounter: Secondary | ICD-10-CM | POA: Diagnosis not present

## 2019-08-05 ENCOUNTER — Telehealth: Payer: Self-pay | Admitting: Family Medicine

## 2019-08-05 ENCOUNTER — Telehealth (INDEPENDENT_AMBULATORY_CARE_PROVIDER_SITE_OTHER): Payer: Federal, State, Local not specified - PPO | Admitting: Family Medicine

## 2019-08-05 ENCOUNTER — Encounter: Payer: Self-pay | Admitting: Family Medicine

## 2019-08-05 ENCOUNTER — Other Ambulatory Visit: Payer: Self-pay

## 2019-08-05 VITALS — Temp 95.1°F | Wt 169.0 lb

## 2019-08-05 DIAGNOSIS — R42 Dizziness and giddiness: Secondary | ICD-10-CM

## 2019-08-05 MED ORDER — MECLIZINE HCL 25 MG PO TABS: 25.0000 mg | ORAL_TABLET | Freq: Two times a day (BID) | ORAL | 0 refills | Status: DC | PRN

## 2019-08-05 NOTE — Telephone Encounter (Signed)
Spoke with the pt and informed him of the message below.  Patient stated his wife picked up the Rx approximately 1 hour ago.

## 2019-08-05 NOTE — Telephone Encounter (Signed)
Please let him know I did send at the time of his visit. Meclizine 25mg . But, if not improving or worsening should go get checked out in person as we discussed. Thanks!

## 2019-08-05 NOTE — Telephone Encounter (Signed)
Pt stated he had a virtual visit with Dr.Kim  today to discuss his vertigo symptoms. Pt stated medication was discussed that could be provided to him for his symptoms but wanted to hold off at time of visit. Pt called office asking if Dr. Selena Batten could prescribe him the medication since symptoms are not improving. Please advise.
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
## 2019-08-05 NOTE — Patient Instructions (Signed)
Please see the link below for more information and a treatment for vertigo.  [email or web address]/[email or web address]  Please do not drive with vertigo.  I hope you are feeling better soon! Seek care promptly if your symptoms worsen, new concerns arise or you are not improving with treatment.  

## 2020-01-05 ENCOUNTER — Telehealth: Payer: Self-pay | Admitting: Family Medicine

## 2020-01-05 NOTE — Telephone Encounter (Signed)
Please advise 

## 2020-01-05 NOTE — Telephone Encounter (Signed)
If he feels well overall-have him drop off the form and I can see if he can be completed-sometimes there are specific requests on the forms.  We may have to try to work him in depending on what we see

## 2020-01-05 NOTE — Telephone Encounter (Signed)
Patient is calling in asking if Dr.Hunter would be able to bring in a form for [employer], states that he is needing to get in but with his physical not being until October he is wondering if it could be based on the last physical or if he could schedule an OV.

## 2020-01-06 NOTE — Telephone Encounter (Signed)
Left voice message for patient to call clinic.  

## 2020-01-07 NOTE — Telephone Encounter (Signed)
Ppw in your folder patient last visit was 10/09/2017

## 2020-01-07 NOTE — Telephone Encounter (Signed)
It says I have to examine patient- so lets try to do CPE on 27th at 8 20 if hes willing to come in- ill try to come in before [facility] meeting

## 2020-01-10 NOTE — Telephone Encounter (Signed)
Patient has been scheduled

## 2020-01-10 NOTE — Telephone Encounter (Signed)
Can you call to set up?

## 2020-01-17 NOTE — Patient Instructions (Addendum)
Please stop by lab before you go If you have mychart- we will send your results within 3 business days of [place] receiving them.  If you do not have mychart- we will call you about results within 5 business days of [place] receiving them.   Go ahead and schedule 18-24 month physical before you leave
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
## 2020-01-18 ENCOUNTER — Other Ambulatory Visit: Payer: Self-pay

## 2020-01-18 ENCOUNTER — Encounter: Payer: Self-pay | Admitting: Family Medicine

## 2020-01-18 ENCOUNTER — Ambulatory Visit (INDEPENDENT_AMBULATORY_CARE_PROVIDER_SITE_OTHER): Payer: Federal, State, Local not specified - PPO | Admitting: Family Medicine

## 2020-01-18 VITALS — BP 122/70 | HR 50 | Temp 98.4°F | Ht 69.0 in | Wt 174.8 lb

## 2020-01-18 DIAGNOSIS — E785 Hyperlipidemia, unspecified: Secondary | ICD-10-CM | POA: Diagnosis not present

## 2020-01-18 DIAGNOSIS — Z114 Encounter for screening for human immunodeficiency virus [HIV]: Secondary | ICD-10-CM | POA: Diagnosis not present

## 2020-01-18 DIAGNOSIS — Z1159 Encounter for screening for other viral diseases: Secondary | ICD-10-CM

## 2020-01-18 DIAGNOSIS — Z0000 Encounter for general adult medical examination without abnormal findings: Secondary | ICD-10-CM | POA: Diagnosis not present

## 2020-01-18 NOTE — Addendum Note (Signed)
Addended by: Altamese Cabal on: 01/18/2020 08:58 AM   Modules accepted: Orders

## 2020-01-19 LAB — LIPID PANEL
Cholesterol: 181 mg/dL (ref <200)
HDL: 55 mg/dL (ref >=40)
LDL Cholesterol (Calc): 111 mg/dL (calc) — ABNORMAL HIGH
Non-HDL Cholesterol (Calc): 126 mg/dL (calc) (ref <130)
Total CHOL/HDL Ratio: 3.3 (calc) (ref <5.0)
Triglycerides: 67 mg/dL (ref <150)

## 2020-01-19 LAB — COMPREHENSIVE METABOLIC PANEL
AG Ratio: 2.2 (calc) (ref 1.0–2.5)
ALT: 16 U/L (ref 9–46)
AST: 20 U/L (ref 10–40)
Albumin: 4.6 g/dL (ref 3.6–5.1)
Alkaline phosphatase (APISO): 44 U/L (ref 36–130)
BUN: 18 mg/dL (ref 7–25)
CO2: 29 mmol/L (ref 20–32)
Calcium: 9.4 mg/dL (ref 8.6–10.3)
Chloride: 104 mmol/L (ref 98–110)
Creat: 1.34 mg/dL (ref 0.60–1.35)
Globulin: 2.1 g/dL (calc) (ref 1.9–3.7)
Glucose, Bld: 95 mg/dL (ref 65–99)
Potassium: 4.4 mmol/L (ref 3.5–5.3)
Sodium: 141 mmol/L (ref 135–146)
Total Bilirubin: 0.4 mg/dL (ref 0.2–1.2)
Total Protein: 6.7 g/dL (ref 6.1–8.1)

## 2020-01-19 LAB — CBC WITH DIFFERENTIAL/PLATELET
Absolute Monocytes: 456 cells/uL (ref 200–950)
Basophils Absolute: 31 cells/uL (ref 0–200)
Basophils Relative: 0.8 %
Eosinophils Absolute: 207 cells/uL (ref 15–500)
Eosinophils Relative: 5.3 %
HCT: 43.4 % (ref 38.5–50.0)
Hemoglobin: 14.5 g/dL (ref 13.2–17.1)
Lymphs Abs: 1665 cells/uL (ref 850–3900)
MCH: 29.6 pg (ref 27.0–33.0)
MCHC: 33.4 g/dL (ref 32.0–36.0)
MCV: 88.6 fL (ref 80.0–100.0)
MPV: 10.9 fL (ref 7.5–12.5)
Monocytes Relative: 11.7 %
Neutro Abs: 1541 cells/uL (ref 1500–7800)
Neutrophils Relative %: 39.5 %
Platelets: 209 10*3/uL (ref 140–400)
RBC: 4.9 10*6/uL (ref 4.20–5.80)
RDW: 13 % (ref 11.0–15.0)
Total Lymphocyte: 42.7 %
WBC: 3.9 10*3/uL (ref 3.8–10.8)

## 2020-01-24 ENCOUNTER — Encounter: Payer: Self-pay | Admitting: Family Medicine

## 2020-02-21 ENCOUNTER — Ambulatory Visit: Payer: Federal, State, Local not specified - PPO | Admitting: Registered Nurse

## 2020-03-08 ENCOUNTER — Encounter: Payer: Self-pay | Admitting: Family Medicine

## 2020-03-21 DIAGNOSIS — Z20822 Contact with and (suspected) exposure to covid-19: Secondary | ICD-10-CM | POA: Diagnosis not present

## 2020-03-25 DIAGNOSIS — Z20822 Contact with and (suspected) exposure to covid-19: Secondary | ICD-10-CM | POA: Diagnosis not present

## 2020-03-28 DIAGNOSIS — Z20822 Contact with and (suspected) exposure to covid-19: Secondary | ICD-10-CM | POA: Diagnosis not present

## 2020-04-01 DIAGNOSIS — Z20822 Contact with and (suspected) exposure to covid-19: Secondary | ICD-10-CM | POA: Diagnosis not present

## 2020-04-04 DIAGNOSIS — Z20822 Contact with and (suspected) exposure to covid-19: Secondary | ICD-10-CM | POA: Diagnosis not present

## 2020-04-08 DIAGNOSIS — Z20822 Contact with and (suspected) exposure to covid-19: Secondary | ICD-10-CM | POA: Diagnosis not present

## 2020-04-11 DIAGNOSIS — Z20822 Contact with and (suspected) exposure to covid-19: Secondary | ICD-10-CM | POA: Diagnosis not present

## 2020-04-15 DIAGNOSIS — Z20822 Contact with and (suspected) exposure to covid-19: Secondary | ICD-10-CM | POA: Diagnosis not present

## 2020-04-17 ENCOUNTER — Encounter: Payer: Federal, State, Local not specified - PPO | Admitting: Family Medicine

## 2021-07-13 NOTE — Patient Instructions (Signed)
Health Maintenance Due  Topic Date Due   Hepatitis C Screening  Never done   INFLUENZA VACCINE  Never done    Recommended follow up: No follow-ups on file.

## 2021-07-13 NOTE — Progress Notes (Signed)
I believe this was rescheduled

## 2021-07-20 ENCOUNTER — Ambulatory Visit (INDEPENDENT_AMBULATORY_CARE_PROVIDER_SITE_OTHER): Payer: Federal, State, Local not specified - PPO | Admitting: Family Medicine

## 2021-07-20 DIAGNOSIS — E785 Hyperlipidemia, unspecified: Secondary | ICD-10-CM

## 2021-07-20 DIAGNOSIS — Z0000 Encounter for general adult medical examination without abnormal findings: Secondary | ICD-10-CM

## 2022-01-04 ENCOUNTER — Encounter: Payer: Self-pay | Admitting: Family Medicine

## 2022-01-14 ENCOUNTER — Encounter: Payer: Self-pay | Admitting: Family Medicine

## 2022-01-14 ENCOUNTER — Ambulatory Visit (INDEPENDENT_AMBULATORY_CARE_PROVIDER_SITE_OTHER): Payer: No Typology Code available for payment source | Admitting: Family Medicine

## 2022-01-14 VITALS — BP 112/62 | HR 62 | Temp 98.3°F | Ht 69.0 in | Wt 178.4 lb

## 2022-01-14 DIAGNOSIS — Z0000 Encounter for general adult medical examination without abnormal findings: Secondary | ICD-10-CM

## 2022-01-14 DIAGNOSIS — E785 Hyperlipidemia, unspecified: Secondary | ICD-10-CM
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
## 2022-01-14 NOTE — Patient Instructions (Addendum)
Please go to [facility]  - located [address] across the street from [place] - in the basement - Hours: 7:30-5:30 PM M-F. You do NOT need an appointment.  - ideally fasting if possible   Mild weight loss in 5-10 lb range over next year reasonable  Recommended follow up: Return in about 1 year (around 01/15/2023) for physical or sooner if needed.Schedule b4 you leave.

## 2022-03-18 ENCOUNTER — Encounter: Payer: Self-pay | Admitting: *Deleted

## 2022-06-06 ENCOUNTER — Encounter: Payer: Self-pay | Admitting: *Deleted

## 2023-01-20 ENCOUNTER — Ambulatory Visit (INDEPENDENT_AMBULATORY_CARE_PROVIDER_SITE_OTHER): Payer: No Typology Code available for payment source | Admitting: Family Medicine

## 2023-01-20 ENCOUNTER — Encounter: Payer: Self-pay | Admitting: Family Medicine

## 2023-01-20 VITALS — BP 110/60 | HR 57 | Temp 98.2°F | Ht 69.0 in | Wt 176.2 lb

## 2023-01-20 DIAGNOSIS — E785 Hyperlipidemia, unspecified: Secondary | ICD-10-CM | POA: Diagnosis not present

## 2023-01-20 DIAGNOSIS — Z0000 Encounter for general adult medical examination without abnormal findings: Secondary | ICD-10-CM | POA: Diagnosis not present

## 2023-01-20 LAB — COMPREHENSIVE METABOLIC PANEL
ALT: 17 U/L (ref 0–53)
AST: 14 U/L (ref 0–37)
Albumin: 4.3 g/dL (ref 3.5–5.2)
Alkaline Phosphatase: 52 U/L (ref 39–117)
BUN: 18 mg/dL (ref 6–23)
CO2: 30 mEq/L (ref 19–32)
Calcium: 9.4 mg/dL (ref 8.4–10.5)
Chloride: 102 mEq/L (ref 96–112)
Creatinine, Ser: 1.39 mg/dL (ref 0.40–1.50)
GFR: 63.28 mL/min (ref >60.00)
Glucose, Bld: 94 mg/dL (ref 70–99)
Potassium: 4.4 mEq/L (ref 3.5–5.1)
Sodium: 138 mEq/L (ref 135–145)
Total Bilirubin: 0.5 mg/dL (ref 0.2–1.2)
Total Protein: 6.8 g/dL (ref 6.0–8.3)

## 2023-01-20 LAB — CBC WITH DIFFERENTIAL/PLATELET
Basophils Absolute: 0 10*3/uL (ref 0.0–0.1)
Basophils Relative: 0.7 % (ref 0.0–3.0)
Eosinophils Absolute: 0.2 10*3/uL (ref 0.0–0.7)
Eosinophils Relative: 4.4 % (ref 0.0–5.0)
HCT: 43.7 % (ref 39.0–52.0)
Hemoglobin: 14.6 g/dL (ref 13.0–17.0)
Lymphocytes Relative: 40.7 % (ref 12.0–46.0)
Lymphs Abs: 1.4 10*3/uL (ref 0.7–4.0)
MCHC: 33.3 g/dL (ref 30.0–36.0)
MCV: 87.8 fl (ref 78.0–100.0)
Monocytes Absolute: 0.4 10*3/uL (ref 0.1–1.0)
Monocytes Relative: 10.2 % (ref 3.0–12.0)
Neutro Abs: 1.6 10*3/uL (ref 1.4–7.7)
Neutrophils Relative %: 44 % (ref 43.0–77.0)
Platelets: 222 10*3/uL (ref 150.0–400.0)
RBC: 4.98 Mil/uL (ref 4.22–5.81)
RDW: 13.8 % (ref 11.5–15.5)
WBC: 3.5 10*3/uL — ABNORMAL LOW (ref 4.0–10.5)

## 2023-01-20 LAB — LIPID PANEL
Cholesterol: 171 mg/dL (ref 0–200)
HDL: 44 mg/dL (ref >39.00)
LDL Cholesterol: 112 mg/dL — ABNORMAL HIGH (ref 0–99)
NonHDL: 126.87
Total CHOL/HDL Ratio: 4
Triglycerides: 75 mg/dL (ref 0.0–149.0)
VLDL: 15 mg/dL (ref 0.0–40.0)
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
## 2023-01-20 NOTE — Patient Instructions (Addendum)
Please stop by lab before you go If you have mychart- we will send your results within 3 business days of [place] receiving them.  If you do not have mychart- we will call you about results within 5 business days of [place] receiving them.  *please also note that you will see labs on mychart as soon as they post. I will later go in and write notes on them- will say "notes from Dr. Durene Cal"   Lets have you get your hearing and eyes checked out  Goal exercise 150 minutes a week- even if you can move the needle and get 30 minutes in every week.  Recommended follow up: Return in about 1 year (around 01/20/2024) for physical or sooner if needed.Schedule b4 you leave.

## 2023-04-01 ENCOUNTER — Encounter: Payer: Self-pay | Admitting: Family Medicine

## 2023-04-02 ENCOUNTER — Other Ambulatory Visit: Payer: Self-pay

## 2023-04-02 DIAGNOSIS — Z8249 Family history of ischemic heart disease and other diseases of the circulatory system: Secondary | ICD-10-CM

## 2023-07-17 ENCOUNTER — Ambulatory Visit: Payer: Commercial Managed Care - PPO | Admitting: Family Medicine

## 2023-07-17 ENCOUNTER — Encounter: Payer: Self-pay | Admitting: Family Medicine

## 2023-07-17 VITALS — BP 102/66 | HR 59 | Temp 97.7°F | Ht 69.0 in | Wt 178.4 lb

## 2023-07-17 DIAGNOSIS — R519 Headache, unspecified: Secondary | ICD-10-CM

## 2023-07-17 DIAGNOSIS — R413 Other amnesia: Secondary | ICD-10-CM | POA: Diagnosis not present

## 2023-07-17 DIAGNOSIS — G253 Myoclonus: Secondary | ICD-10-CM

## 2023-07-17 DIAGNOSIS — R251 Tremor, unspecified: Secondary | ICD-10-CM

## 2023-07-17 DIAGNOSIS — I428 Other cardiomyopathies: Secondary | ICD-10-CM | POA: Diagnosis not present

## 2023-07-17 LAB — COMPREHENSIVE METABOLIC PANEL
ALT: 21 U/L (ref 0–53)
AST: 20 U/L (ref 0–37)
Albumin: 4.6 g/dL (ref 3.5–5.2)
Alkaline Phosphatase: 46 U/L (ref 39–117)
BUN: 16 mg/dL (ref 6–23)
CO2: 29 meq/L (ref 19–32)
Calcium: 9.3 mg/dL (ref 8.4–10.5)
Chloride: 103 meq/L (ref 96–112)
Creatinine, Ser: 1.41 mg/dL (ref 0.40–1.50)
GFR: 61.99 mL/min (ref >60.00)
Glucose, Bld: 86 mg/dL (ref 70–99)
Potassium: 3.9 meq/L (ref 3.5–5.1)
Sodium: 139 meq/L (ref 135–145)
Total Bilirubin: 0.6 mg/dL (ref 0.2–1.2)
Total Protein: 7 g/dL (ref 6.0–8.3)

## 2023-07-17 LAB — CBC WITH DIFFERENTIAL/PLATELET
Basophils Absolute: 0 10*3/uL (ref 0.0–0.1)
Basophils Relative: 0.8 % (ref 0.0–3.0)
Eosinophils Absolute: 0.1 10*3/uL (ref 0.0–0.7)
Eosinophils Relative: 1.6 % (ref 0.0–5.0)
HCT: 43.5 % (ref 39.0–52.0)
Hemoglobin: 14.6 g/dL (ref 13.0–17.0)
Lymphocytes Relative: 44.7 % (ref 12.0–46.0)
Lymphs Abs: 1.7 10*3/uL (ref 0.7–4.0)
MCHC: 33.7 g/dL (ref 30.0–36.0)
MCV: 87.7 fL (ref 78.0–100.0)
Monocytes Absolute: 0.3 10*3/uL (ref 0.1–1.0)
Monocytes Relative: 8.3 % (ref 3.0–12.0)
Neutro Abs: 1.7 10*3/uL (ref 1.4–7.7)
Neutrophils Relative %: 44.6 % (ref 43.0–77.0)
Platelets: 225 10*3/uL (ref 150.0–400.0)
RBC: 4.96 Mil/uL (ref 4.22–5.81)
RDW: 13.8 % (ref 11.5–15.5)
WBC: 3.9 10*3/uL — ABNORMAL LOW (ref 4.0–10.5)

## 2023-07-17 LAB — VITAMIN B12: Vitamin B-12: 248 pg/mL (ref 211–911)

## 2023-07-17 LAB — TSH: TSH: 1.27 u[IU]/mL (ref 0.35–5.50)

## 2023-07-17 NOTE — Patient Instructions (Addendum)
Please stop by lab before you go If you have mychart- we will send your results within 3 business days of [place] receiving them.  If you do not have mychart- we will call you about results within 5 business days of [place] receiving them.  *please also note that you will see labs on mychart as soon as they post. I will later go in and write notes on them- will say "notes from Dr. Durene Cal"   We have placed a referral for you today to Dr. Arbutus Leas with [facility]. In some cases you will see # listed below- you can call this if you have not heard within a week. If you do not see # listed- you should receive a mychart message or phone call within a week with the # to call directly- call that as soon as you get it. If you are having issues getting scheduled reach out to [place] again.   We will call you within two weeks about your referral for MRI brain through [facility].  Their phone number is [phone].  Please call them if you have not heard in 1-2 weeks  Recommended follow up: Return for next already scheduled visit or sooner if needed.

## 2023-07-17 NOTE — Progress Notes (Signed)
Phone [phone] In person visit   Subjective:   John Yang is a 43 y.o. year old very pleasant male patient who presents for/with See problem oriented charting Chief Complaint  Patient presents with   hand and arm tremors    Pt c/o hand and arm tremors bilaterally that he has noticed over the past 2 years when sitting still and on occasion whole body will jerk.   Headache    Pt c/o frequent headache.   Memory Loss    Pt c/o noticeable memory loss and concentration issues.    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   No current facility-administered medications for this visit.     Objective:  BP 102/66   Pulse (!) 59   Temp 97.7 F (36.5 C)   Ht 5\' 9"  (1.753 m)   Wt 178 lb 6.4 oz (80.9 kg)   SpO2 99%   BMI 26.35 kg/m  Gen: NAD, resting comfortably CV: RRR no murmurs rubs or gallops Lungs: CTAB no crackles, wheeze, rhonchi Abdomen: soft/nontender/nondistended/normal bowel sounds. No rebound or guarding.  Ext: no edema Skin: warm, dry  Neuro: CN II-XII intact, sensation and reflexes normal throughout, 5/5 muscle strength in bilateral upper and lower extremities.  Intention tremor noted with finger to nose but was more pronounced with patient holding his side initially during exam. Normal rapid alternating movements. No pronator drift. Normal romberg. Normal gait.      Assessment and Plan   # Isolated left ventricular noncompaction cardiomyopathy- follow up at [facility]: referred to cardiology due to daughters dilated cardiomyopathy due to PVC's- trying to figure out the why (why we referred her) but unrelated to her  condition.  Genetic testing negative.  He is asymptomatic.  Echocardiogram every 6 months, Holter monitor was done as well  A/P: Fortunately patient is asymptomatic and also fortunately patient had this incidentally discovered-continue close follow-up with [facility]  # Hand tremor (both resting and with intention), myoclonus, new onset headache pattern, memory loss S:  noticeable issues for  the following over the last 1-2 years  he had mildly noticed in the past but then wife and family started noticing. If sitting on couch may get a jerk - usually adduction of legs and then arms also in similar direction. No issues with movement. Can lay on the bed and it occurs. Spontaneous. Not sure if worse on long day or if tired.   also noted tremors in hands- usually notes shaking while sitting still. Also hand shaking as tries to hold a drink. Has not noted handwriting issues but mainly types.  Has also noted  more frequent headaches (3-4 per week and this is new in last 1-2 years- very rare headaches before- ibuprofen helpful but needing frequently at least each headache- usually better in hour but debilitating with work if doesn't take medicine) in last 1-2 years along with some forgetfulness- could forget one item of 3 when going to [employer]- this precedes stressors with daughter. Mixes up two workers at work which is atypical for him.  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 No slurred words or trouble swallowing. no blurry vision or double vision. No paresthesias. No confusion (but does have memory concern0 or word finding difficulties.   A/P: With tremor in his hands that occurs at both rest along with intention in addition to myoclonus of upper and lower body-we opted to refer patient to Dr. Arbutus Leas movement disorder specialist for her expert opinion  With worsening headache pattern and reported memory  loss-memory loss workup was ordered including MRI of the brain -declines HIV and rpr testing- only active with wife -I support him getting his hearing checked for hearing loss  Recommended follow up: Return for next already scheduled visit or sooner if needed. Future Appointments  Date Time Provider Department Center  07/31/2023  7:40 AM [facility] MR 3 [facility]  01/30/2024  9:00 AM Shelva Majestic, MD [facility]    Lab/Order associations:   ICD-10-CM   1. Nonintractable episodic headache, unspecified headache type  R51.9 Ambulatory referral to Neurology    MR Brain Wo Contrast    2. Memory loss  R41.3 Ambulatory referral to Neurology    MR Brain Wo Contrast    Vitamin B12    TSH    Comprehensive metabolic panel    CBC with Differential/Platelet    3. Myoclonus  G25.3 Ambulatory referral to Neurology    MR Brain Wo Contrast    4. Tremor  R25.1 Ambulatory referral to Neurology    MR Brain Wo Contrast    5. Left ventricular noncompaction cardiomyopathy (HCC)  I42.8       No orders of the defined types were placed in this encounter.   Time Spent: 41 minutes of total time (11:37 AM-12:01 PM,5:20 PM-5:27 PM) was spent on the date of the encounter performing the following actions: chart review prior to seeing the patient including review of [facility] records with cardiology, obtaining history, performing a medically necessary exam, counseling on the planned workup and treatment plan, placing orders, and documenting in our EHR.    Return precautions advised.  Tana Conch, MD

## 2023-07-17 NOTE — Assessment & Plan Note (Signed)
#   Isolated left ventricular noncompaction cardiomyopathy- follow up at [facility]: referred to cardiology due to daughters dilated cardiomyopathy due to PVC's- trying to figure out the why (why we referred her) but unrelated to her condition.  Genetic testing negative.  He is asymptomatic.  Echocardiogram every 6 months, Holter monitor was done as well  A/P: Fortunately patient is asymptomatic and also fortunately patient had this incidentally discovered-continue close follow-up with [facility]

## 2023-07-18 ENCOUNTER — Telehealth: Payer: Self-pay

## 2023-07-18 ENCOUNTER — Encounter: Payer: Self-pay | Admitting: Neurology

## 2023-07-18 NOTE — Telephone Encounter (Signed)
I have spoken with Misty Stanley and she is working on this.  Copied from CRM [id number]. Topic: Clinical - Request for Lab/Test Order >> Jul 17, 2023  3:45 PM Maxwell Marion wrote: Reason for CRM: Felicia at [facility] called because they are needing pre-authorization for patient's MR Brain w/o contrast. Needs to be completed by February 5th, no later than 10 AM. Fax number is [phone].
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
## 2023-07-29 ENCOUNTER — Ambulatory Visit: Payer: Commercial Managed Care - PPO | Admitting: Neurology

## 2023-07-29 ENCOUNTER — Encounter: Payer: Self-pay | Admitting: Neurology

## 2023-07-29 VITALS — BP 126/80 | HR 55 | Ht 69.0 in | Wt 180.0 lb

## 2023-07-29 DIAGNOSIS — R251 Tremor, unspecified: Secondary | ICD-10-CM

## 2023-07-29 DIAGNOSIS — G253 Myoclonus: Secondary | ICD-10-CM

## 2023-07-29 DIAGNOSIS — N289 Disorder of kidney and ureter, unspecified: Secondary | ICD-10-CM | POA: Diagnosis not present

## 2023-07-29 NOTE — Patient Instructions (Signed)
 We discussed decreasing your ibuprofen.  We discussed that we could trial some medication as needed for tremor but we decided to hold off for now.  The physicians and staff at [facility] are committed to providing excellent care. You may receive a survey requesting feedback about your experience at our office. We strive to receive very good responses to the survey questions. If you feel that your experience would prevent you from giving the office a very good  response, please contact our office to try to remedy the situation. We may be reached at [phone]. Thank you for taking the time out of your busy day to complete the survey.

## 2023-07-31 ENCOUNTER — Encounter: Payer: Self-pay | Admitting: Family Medicine

## 2023-07-31 ENCOUNTER — Ambulatory Visit
Admission: RE | Admit: 2023-07-31 | Discharge: 2023-07-31 | Disposition: A | Discharge status: Home / Self Care | Payer: Commercial Managed Care - PPO | Source: Ambulatory Visit | Attending: Family Medicine | Admitting: Family Medicine

## 2023-07-31 DIAGNOSIS — G253 Myoclonus: Secondary | ICD-10-CM

## 2023-07-31 DIAGNOSIS — R413 Other amnesia: Secondary | ICD-10-CM

## 2023-07-31 DIAGNOSIS — R251 Tremor, unspecified: Secondary | ICD-10-CM

## 2023-07-31 DIAGNOSIS — R519 Headache, unspecified: Secondary | ICD-10-CM

## 2023-08-01 ENCOUNTER — Encounter: Payer: Self-pay | Admitting: Neurology

## 2024-01-21 ENCOUNTER — Encounter: Payer: No Typology Code available for payment source | Admitting: Family Medicine

## 2024-01-30 ENCOUNTER — Ambulatory Visit: Payer: Commercial Managed Care - PPO | Admitting: Family Medicine

## 2024-01-30 ENCOUNTER — Encounter: Payer: Self-pay | Admitting: Family Medicine

## 2024-01-30 ENCOUNTER — Ambulatory Visit: Payer: Self-pay | Admitting: Family Medicine

## 2024-01-30 VITALS — BP 100/70 | HR 53 | Temp 97.7°F | Ht 69.0 in | Wt 172.8 lb

## 2024-01-30 DIAGNOSIS — Z0000 Encounter for general adult medical examination without abnormal findings: Secondary | ICD-10-CM

## 2024-01-30 DIAGNOSIS — Z131 Encounter for screening for diabetes mellitus: Secondary | ICD-10-CM

## 2024-01-30 DIAGNOSIS — E785 Hyperlipidemia, unspecified: Secondary | ICD-10-CM | POA: Diagnosis not present

## 2024-01-30 DIAGNOSIS — E538 Deficiency of other specified B group vitamins: Secondary | ICD-10-CM | POA: Diagnosis not present

## 2024-01-30 DIAGNOSIS — E663 Overweight: Secondary | ICD-10-CM | POA: Diagnosis not present

## 2024-01-30 LAB — COMPREHENSIVE METABOLIC PANEL WITH GFR
ALT: 26 U/L (ref 0–53)
AST: 19 U/L (ref 0–37)
Albumin: 4.6 g/dL (ref 3.5–5.2)
Alkaline Phosphatase: 51 U/L (ref 39–117)
BUN: 19 mg/dL (ref 6–23)
CO2: 31 meq/L (ref 19–32)
Calcium: 9.4 mg/dL (ref 8.4–10.5)
Chloride: 102 meq/L (ref 96–112)
Creatinine, Ser: 1.37 mg/dL (ref 0.40–1.50)
GFR: 63.93 mL/min (ref >60.00)
Glucose, Bld: 85 mg/dL (ref 70–99)
Potassium: 4.2 meq/L (ref 3.5–5.1)
Sodium: 141 meq/L (ref 135–145)
Total Bilirubin: 0.6 mg/dL (ref 0.2–1.2)
Total Protein: 6.8 g/dL (ref 6.0–8.3)

## 2024-01-30 LAB — LIPID PANEL
Cholesterol: 205 mg/dL — ABNORMAL HIGH (ref 0–200)
HDL: 54.8 mg/dL (ref >39.00)
LDL Cholesterol: 133 mg/dL — ABNORMAL HIGH (ref 0–99)
NonHDL: 149.89
Total CHOL/HDL Ratio: 4
Triglycerides: 85 mg/dL (ref 0.0–149.0)
VLDL: 17 mg/dL (ref 0.0–40.0)

## 2024-01-30 LAB — CBC WITH DIFFERENTIAL/PLATELET
Basophils Absolute: 0 K/uL (ref 0.0–0.1)
Basophils Relative: 0.5 % (ref 0.0–3.0)
Eosinophils Absolute: 0.1 K/uL (ref 0.0–0.7)
Eosinophils Relative: 3 % (ref 0.0–5.0)
HCT: 43.5 % (ref 39.0–52.0)
Hemoglobin: 14.4 g/dL (ref 13.0–17.0)
Lymphocytes Relative: 38.1 % (ref 12.0–46.0)
Lymphs Abs: 1.6 K/uL (ref 0.7–4.0)
MCHC: 33.1 g/dL (ref 30.0–36.0)
MCV: 87.6 fl (ref 78.0–100.0)
Monocytes Absolute: 0.4 K/uL (ref 0.1–1.0)
Monocytes Relative: 9 % (ref 3.0–12.0)
Neutro Abs: 2 K/uL (ref 1.4–7.7)
Neutrophils Relative %: 49.4 % (ref 43.0–77.0)
Platelets: 223 K/uL (ref 150.0–400.0)
RBC: 4.96 Mil/uL (ref 4.22–5.81)
RDW: 13.8 % (ref 11.5–15.5)
WBC: 4.2 K/uL (ref 4.0–10.5)

## 2024-01-30 LAB — VITAMIN B12: Vitamin B-12: 386 pg/mL (ref 211–911)

## 2024-01-30 LAB — HEMOGLOBIN A1C: Hgb A1c MFr Bld: 5.6 % (ref 4.6–6.5)
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
## 2024-01-30 NOTE — Patient Instructions (Addendum)
 Please stop by lab before you go If you have mychart- we will send your results within 3 business days of us  receiving them.  If you do not have mychart- we will call you about results within 5 business days of us  receiving them.  *please also note that you will see labs on mychart as soon as they post. I will later go in and write notes on them- will say notes from Dr. Katrinka   add strength training when able- great on exercise and healthy diet though!   You completed your annual physical today  Recommended follow up: Return in about 1 year (around 01/29/2025) for physical or sooner if needed.Schedule b4 you leave.

## 2025-02-04 ENCOUNTER — Encounter: Admitting: Family Medicine
